# Patient Record
Sex: Male | Born: 1990 | Race: White | Hispanic: No | Marital: Single
Health system: Southern US, Community
[De-identification: ages and names within clinical notes are randomized; demographics above are authoritative.]

## PROBLEM LIST (undated history)

## (undated) DIAGNOSIS — J40 Bronchitis, not specified as acute or chronic: Secondary | ICD-10-CM

## (undated) DIAGNOSIS — Z9109 Other allergy status, other than to drugs and biological substances: Secondary | ICD-10-CM

## (undated) HISTORY — PX: APPENDECTOMY: SHX54

---
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
## 2012-03-01 ENCOUNTER — Encounter (HOSPITAL_COMMUNITY): Payer: Self-pay

## 2012-03-01 ENCOUNTER — Emergency Department (INDEPENDENT_AMBULATORY_CARE_PROVIDER_SITE_OTHER)
Admission: EM | Admit: 2012-03-01 | Discharge: 2012-03-01 | Disposition: A | Discharge status: Home / Self Care | Payer: Medicaid Other | Source: Home / Self Care | Attending: Emergency Medicine | Admitting: Emergency Medicine

## 2012-03-01 DIAGNOSIS — J069 Acute upper respiratory infection, unspecified: Secondary | ICD-10-CM

## 2012-03-01 DIAGNOSIS — J209 Acute bronchitis, unspecified: Secondary | ICD-10-CM

## 2012-03-01 MED ORDER — SALINE NASAL SPRAY 0.65 % NA SOLN: 1.0000 | NASAL | Status: DC | PRN

## 2012-03-01 MED ORDER — ALBUTEROL SULFATE HFA 108 (90 BASE) MCG/ACT IN AERS: 2.0000 | INHALATION_SPRAY | Freq: Four times a day (QID) | RESPIRATORY_TRACT | Status: DC | PRN

## 2012-03-01 MED ORDER — CETIRIZINE-PSEUDOEPHEDRINE ER 5-120 MG PO TB12: 1.0000 | ORAL_TABLET | Freq: Two times a day (BID) | ORAL | Status: DC

## 2012-03-01 NOTE — ED Provider Notes (Signed)
History     CSN: [id number]  Arrival date & time 03/01/12  1249   None     Chief Complaint  Patient presents with  . URI    (Consider location/radiation/quality/duration/timing/severity/associated sxs/prior treatment) Patient is a 21 y.o. male presenting with URI. The history is provided by the patient and a friend.  URI The primary symptoms include sore throat, cough and wheezing. Primary symptoms do not include fever or ear pain. The current episode started 3 to 5 days ago. This is a new problem.  The sore throat began more than 2 days ago. The sore throat is not accompanied by trouble swallowing or stridor.  Symptoms associated with the illness include chills, sinus pressure, congestion and rhinorrhea.  Francis Walters is a 21 y.o. male who complains of onset of cold symptoms for 3 days.  Pt has taken nyquil and motrin with minimal symptom relief.  +sore throat +cough productive No pleuritic pain +wheezing +nasal congestion +Post-nasal drainage + sinus pain/pressure No voice changes +chest congestion no itchy/red eyes no earache No hemoptysis No SOB +sweats +Fever last evening  No nausea No vomiting No abdominal pain No diarrhea No skin rashes No fatigue +myalgias No headache  +ill contacts   History reviewed. No pertinent past medical history.  History reviewed. No pertinent past surgical history.  History reviewed. No pertinent family history.  History  Substance Use Topics  . Smoking status: Not on file  . Smokeless tobacco: Not on file  . Alcohol Use: Not on file      Review of Systems  Constitutional: Positive for chills. Negative for fever, diaphoresis and appetite change.  HENT: Positive for congestion, sore throat, rhinorrhea, sneezing, postnasal drip and sinus pressure. Negative for hearing loss, ear pain, trouble swallowing, neck pain, neck stiffness, dental problem, voice change, tinnitus and ear discharge.   Respiratory: Positive for cough and  wheezing. Negative for chest tightness, shortness of breath and stridor.   Cardiovascular: Negative.     Allergies  Review of patient's allergies indicates not on file. NKDA  Home Medications   Current Outpatient Rx  Name Route Sig Dispense Refill  . ALBUTEROL SULFATE HFA 108 (90 BASE) MCG/ACT IN AERS Inhalation Inhale 2 puffs into the lungs every 6 (six) hours as needed for wheezing. 1 Inhaler 0  . CETIRIZINE-PSEUDOEPHEDRINE ER 5-120 MG PO TB12 Oral Take 1 tablet by mouth 2 (two) times daily. 30 tablet 2  . SALINE NASAL SPRAY 0.65 % NA SOLN Nasal Place 1 spray into the nose as needed for congestion. 30 mL 12    BP 145/77  Pulse 74  Temp 99 F (37.2 C) (Oral)  Resp 18  SpO2 98%  Physical Exam  Nursing note and vitals reviewed. Constitutional: He is oriented to person, place, and time. Vital signs are normal. He appears well-developed and well-nourished. He is active and cooperative.  HENT:  Head: Normocephalic.  Right Ear: Hearing, external ear and ear canal normal.  Left Ear: Hearing, external ear and ear canal normal.  Nose: Nose normal. Right sinus exhibits no maxillary sinus tenderness and no frontal sinus tenderness. Left sinus exhibits no maxillary sinus tenderness and no frontal sinus tenderness.  Mouth/Throat: Uvula is midline and mucous membranes are normal. No oropharyngeal exudate, posterior oropharyngeal edema or posterior oropharyngeal erythema.       Post nasal drip noted, Bilateral cerumen impaction  Eyes: Conjunctivae are normal. Pupils are equal, round, and reactive to light. No scleral icterus.  Neck: Trachea normal. Neck supple. No muscular  tenderness present.  Cardiovascular: Normal rate, regular rhythm, normal heart sounds and normal pulses.   Pulmonary/Chest: Effort normal and breath sounds normal.  Lymphadenopathy:       Head (right side): No submental, no submandibular, no tonsillar, no preauricular, no posterior auricular and no occipital adenopathy  present.       Head (left side): No submental, no submandibular, no tonsillar, no preauricular, no posterior auricular and no occipital adenopathy present.    He has no cervical adenopathy.  Neurological: He is alert and oriented to person, place, and time. No cranial nerve deficit or sensory deficit.  Skin: Skin is warm and dry.  Psychiatric: He has a normal mood and affect. His speech is normal and behavior is normal. Judgment and thought content normal. Cognition and memory are normal.    ED Course  Procedures (including critical care time)  Labs Reviewed - No data to display No results found.   1. URI (upper respiratory infection)   2. Acute bronchitis       MDM  Increase fluid intake, rest.  Begin expectorant/decongestant, topical decongestant, saline nasal spray and/or saline irrigation, and cough suppressant at bedtime. Antihistamines of your choice (Claritin or Zyrtec).  Tylenol or Motrin for fever/discomfort.  Followup with PCP if not improving 7 to 10 days.         Johnsie Kindred, NP 03/01/12 1608

## 2012-03-01 NOTE — ED Notes (Signed)
C/O COUGH, CONGESTION ; green nasal secretions, grey sputum w blood streaks; gums are blue (not normal for him) NAD

## 2012-03-02 NOTE — ED Provider Notes (Signed)
Medical screening examination/treatment/procedure(s) were performed by non-physician practitioner and as supervising physician I was immediately available for consultation/collaboration.  Luiz Blare MD   Luiz Blare, MD 03/02/12 2106

## 2012-04-26 ENCOUNTER — Encounter (HOSPITAL_COMMUNITY)
Admission: EM | Disposition: A | Discharge status: Home / Self Care | Payer: Self-pay | Source: Home / Self Care | Attending: Emergency Medicine

## 2012-04-26 ENCOUNTER — Encounter (HOSPITAL_COMMUNITY): Payer: Self-pay | Admitting: Anesthesiology

## 2012-04-26 ENCOUNTER — Encounter (HOSPITAL_COMMUNITY): Payer: Self-pay | Admitting: *Deleted

## 2012-04-26 ENCOUNTER — Emergency Department (HOSPITAL_COMMUNITY): Payer: Medicaid Other | Admitting: Anesthesiology

## 2012-04-26 ENCOUNTER — Observation Stay (HOSPITAL_COMMUNITY)
Admission: EM | Admit: 2012-04-26 | Discharge: 2012-04-27 | Disposition: A | Discharge status: Home / Self Care | Payer: Medicaid Other | Attending: Surgery | Admitting: Surgery

## 2012-04-26 ENCOUNTER — Emergency Department (HOSPITAL_COMMUNITY): Payer: Medicaid Other

## 2012-04-26 DIAGNOSIS — K3580 Unspecified acute appendicitis: Principal | ICD-10-CM | POA: Insufficient documentation

## 2012-04-26 DIAGNOSIS — K37 Unspecified appendicitis: Secondary | ICD-10-CM

## 2012-04-26 DIAGNOSIS — Z23 Encounter for immunization: Secondary | ICD-10-CM | POA: Insufficient documentation

## 2012-04-26 HISTORY — PX: LAPAROSCOPIC APPENDECTOMY: SHX408

## 2012-04-26 LAB — CBC WITH DIFFERENTIAL/PLATELET
Basophils Relative: 0 % (ref 0–1)
Eosinophils Absolute: 0 10*3/uL (ref 0.0–0.7)
HCT: 40.3 % (ref 39.0–52.0)
Hemoglobin: 14.4 g/dL (ref 13.0–17.0)
MCH: 27.9 pg (ref 26.0–34.0)
MCHC: 35.7 g/dL (ref 30.0–36.0)
Monocytes Absolute: 1.3 10*3/uL — ABNORMAL HIGH (ref 0.1–1.0)
Monocytes Relative: 8 % (ref 3–12)
Neutro Abs: 13.4 10*3/uL — ABNORMAL HIGH (ref 1.7–7.7)

## 2012-04-26 LAB — POCT I-STAT, CHEM 8
BUN: 10 mg/dL (ref 6–23)
Chloride: 102 mEq/L (ref 96–112)
Creatinine, Ser: 1 mg/dL (ref 0.50–1.35)
Sodium: 138 mEq/L (ref 135–145)
TCO2: 22 mmol/L (ref 0–100)

## 2012-04-26 LAB — URINALYSIS, ROUTINE W REFLEX MICROSCOPIC
Glucose, UA: NEGATIVE mg/dL
Hgb urine dipstick: NEGATIVE
Protein, ur: NEGATIVE mg/dL
pH: 6.5 (ref 5.0–8.0)

## 2012-04-26 SURGERY — APPENDECTOMY, LAPAROSCOPIC
Anesthesia: General | Site: Abdomen | Wound class: Clean Contaminated

## 2012-04-26 MED ORDER — DEXAMETHASONE SODIUM PHOSPHATE 4 MG/ML IJ SOLN
INTRAMUSCULAR | Status: DC | PRN
  Administered 2012-04-26: 4 mg via INTRAVENOUS

## 2012-04-26 MED ORDER — MOXIFLOXACIN HCL IN NACL 400 MG/250ML IV SOLN
400.0000 mg | Freq: Once | INTRAVENOUS | Status: AC
  Administered 2012-04-26 (×2): 400 mg via INTRAVENOUS
  Filled 2012-04-26: qty 250

## 2012-04-26 MED ORDER — OXYCODONE HCL 5 MG PO TABS: 5.0000 mg | ORAL_TABLET | Freq: Once | ORAL | Status: AC | PRN

## 2012-04-26 MED ORDER — MIDAZOLAM HCL 5 MG/5ML IJ SOLN
INTRAMUSCULAR | Status: DC | PRN
  Administered 2012-04-26: 2 mg via INTRAVENOUS

## 2012-04-26 MED ORDER — NEOSTIGMINE METHYLSULFATE 1 MG/ML IJ SOLN
INTRAMUSCULAR | Status: DC | PRN
  Administered 2012-04-26: 3 mg via INTRAVENOUS

## 2012-04-26 MED ORDER — GLYCOPYRROLATE 0.2 MG/ML IJ SOLN
INTRAMUSCULAR | Status: DC | PRN
  Administered 2012-04-26: 0.4 mg via INTRAVENOUS

## 2012-04-26 MED ORDER — ONDANSETRON HCL 4 MG/2ML IJ SOLN
4.0000 mg | Freq: Once | INTRAMUSCULAR | Status: AC
  Administered 2012-04-26: 4 mg via INTRAVENOUS
  Filled 2012-04-26: qty 2

## 2012-04-26 MED ORDER — OXYCODONE HCL 5 MG/5ML PO SOLN: 5.0000 mg | Freq: Once | ORAL | Status: AC | PRN

## 2012-04-26 MED ORDER — IOHEXOL 300 MG/ML  SOLN
20.0000 mL | INTRAMUSCULAR | Status: AC
  Administered 2012-04-26 (×2): 20 mL via ORAL

## 2012-04-26 MED ORDER — ROCURONIUM BROMIDE 100 MG/10ML IV SOLN
INTRAVENOUS | Status: DC | PRN
  Administered 2012-04-26: 20 mg via INTRAVENOUS

## 2012-04-26 MED ORDER — LIDOCAINE HCL (CARDIAC) 20 MG/ML IV SOLN
INTRAVENOUS | Status: DC | PRN
  Administered 2012-04-26: 100 mg via INTRAVENOUS

## 2012-04-26 MED ORDER — HYDROMORPHONE HCL PF 1 MG/ML IJ SOLN: 0.2500 mg | INTRAMUSCULAR | Status: DC | PRN

## 2012-04-26 MED ORDER — IOHEXOL 300 MG/ML  SOLN
100.0000 mL | Freq: Once | INTRAMUSCULAR | Status: AC | PRN
  Administered 2012-04-26: 100 mL via INTRAVENOUS

## 2012-04-26 MED ORDER — SODIUM CHLORIDE 0.9 % IR SOLN
Status: DC | PRN
  Administered 2012-04-26: 1000 mL

## 2012-04-26 MED ORDER — MORPHINE SULFATE 4 MG/ML IJ SOLN: 2.0000 mg | INTRAMUSCULAR | Status: DC | PRN

## 2012-04-26 MED ORDER — PROMETHAZINE HCL 25 MG/ML IJ SOLN: 6.2500 mg | INTRAMUSCULAR | Status: DC | PRN

## 2012-04-26 MED ORDER — LACTATED RINGERS IV SOLN
INTRAVENOUS | Status: DC | PRN
  Administered 2012-04-26: 23:00:00 via INTRAVENOUS

## 2012-04-26 MED ORDER — NICOTINE 21 MG/24HR TD PT24
21.0000 mg | MEDICATED_PATCH | Freq: Once | TRANSDERMAL | Status: DC
  Administered 2012-04-26: 21 mg via TRANSDERMAL
  Filled 2012-04-26: qty 1

## 2012-04-26 MED ORDER — ONDANSETRON HCL 4 MG/2ML IJ SOLN
INTRAMUSCULAR | Status: DC | PRN
  Administered 2012-04-26: 4 mg via INTRAVENOUS

## 2012-04-26 MED ORDER — SUFENTANIL CITRATE 50 MCG/ML IV SOLN
INTRAVENOUS | Status: DC | PRN
  Administered 2012-04-26 (×3): 10 ug via INTRAVENOUS

## 2012-04-26 MED ORDER — LORAZEPAM 2 MG/ML IJ SOLN
1.0000 mg | Freq: Once | INTRAMUSCULAR | Status: AC
  Administered 2012-04-26: 1 mg via INTRAVENOUS
  Filled 2012-04-26: qty 1

## 2012-04-26 MED ORDER — SODIUM CHLORIDE 0.9 % IV BOLUS (SEPSIS)
1000.0000 mL | INTRAVENOUS | Status: AC
  Administered 2012-04-26: 1000 mL via INTRAVENOUS

## 2012-04-26 MED ORDER — PROPOFOL 10 MG/ML IV BOLUS
INTRAVENOUS | Status: DC | PRN
  Administered 2012-04-26: 200 mg via INTRAVENOUS

## 2012-04-26 MED ORDER — BUPIVACAINE-EPINEPHRINE 0.25% -1:200000 IJ SOLN
INTRAMUSCULAR | Status: DC | PRN
  Administered 2012-04-26: 18 mL

## 2012-04-26 MED ORDER — MORPHINE SULFATE 4 MG/ML IJ SOLN
4.0000 mg | Freq: Once | INTRAMUSCULAR | Status: AC
Start: 2012-04-26 — End: 2012-04-26
  Administered 2012-04-26: 4 mg via INTRAVENOUS
  Filled 2012-04-26: qty 1

## 2012-04-26 MED ORDER — SUCCINYLCHOLINE CHLORIDE 20 MG/ML IJ SOLN
INTRAMUSCULAR | Status: DC | PRN
  Administered 2012-04-26: 120 mg via INTRAVENOUS

## 2012-04-26 MED ORDER — LIDOCAINE HCL 4 % MT SOLN
OROMUCOSAL | Status: DC | PRN
  Administered 2012-04-26: 4 mL via TOPICAL

## 2012-04-26 SURGICAL SUPPLY — 43 items
APPLIER CLIP ROT 10 11.4 M/L (STAPLE)
BLADE SURG ROTATE 9660 (MISCELLANEOUS) ×2 IMPLANT
CANISTER SUCTION 2500CC (MISCELLANEOUS) ×2 IMPLANT
CHLORAPREP W/TINT 26ML (MISCELLANEOUS) ×4 IMPLANT
CLIP APPLIE ROT 10 11.4 M/L (STAPLE) IMPLANT
CLOTH BEACON ORANGE TIMEOUT ST (SAFETY) ×2 IMPLANT
COVER SURGICAL LIGHT HANDLE (MISCELLANEOUS) ×2 IMPLANT
CUTTER LINEAR ENDO 35 ETS (STAPLE) IMPLANT
CUTTER LINEAR ENDO 35 ETS TH (STAPLE) ×2 IMPLANT
DECANTER SPIKE VIAL GLASS SM (MISCELLANEOUS) ×2 IMPLANT
DERMABOND ADVANCED (GAUZE/BANDAGES/DRESSINGS) ×1
DERMABOND ADVANCED .7 DNX12 (GAUZE/BANDAGES/DRESSINGS) ×1 IMPLANT
DRAPE UTILITY 15X26 W/TAPE STR (DRAPE) ×4 IMPLANT
ELECT REM PT RETURN 9FT ADLT (ELECTROSURGICAL) ×2
ELECTRODE REM PT RTRN 9FT ADLT (ELECTROSURGICAL) ×1 IMPLANT
ENDOLOOP SUT PDS II  0 18 (SUTURE)
ENDOLOOP SUT PDS II 0 18 (SUTURE) IMPLANT
GLOVE BIO SURGEON STRL SZ8 (GLOVE) ×2 IMPLANT
GLOVE BIOGEL PI IND STRL 8 (GLOVE) ×1 IMPLANT
GLOVE BIOGEL PI INDICATOR 8 (GLOVE) ×1
GOWN PREVENTION PLUS XLARGE (GOWN DISPOSABLE) ×2 IMPLANT
GOWN STRL NON-REIN LRG LVL3 (GOWN DISPOSABLE) ×4 IMPLANT
KIT BASIN OR (CUSTOM PROCEDURE TRAY) ×2 IMPLANT
KIT ROOM TURNOVER OR (KITS) ×2 IMPLANT
NS IRRIG 1000ML POUR BTL (IV SOLUTION) ×2 IMPLANT
PAD ARMBOARD 7.5X6 YLW CONV (MISCELLANEOUS) ×4 IMPLANT
POUCH SPECIMEN RETRIEVAL 10MM (ENDOMECHANICALS) ×2 IMPLANT
RELOAD /EVU35 (ENDOMECHANICALS) IMPLANT
RELOAD CUTTER ETS 35MM STAND (ENDOMECHANICALS) IMPLANT
SCALPEL HARMONIC ACE (MISCELLANEOUS) ×2 IMPLANT
SET IRRIG TUBING LAPAROSCOPIC (IRRIGATION / IRRIGATOR) ×2 IMPLANT
SPECIMEN JAR SMALL (MISCELLANEOUS) ×2 IMPLANT
SUT VIC AB 4-0 PS2 27 (SUTURE) ×2 IMPLANT
SWAB COLLECTION DEVICE MRSA (MISCELLANEOUS) IMPLANT
TOWEL OR 17X24 6PK STRL BLUE (TOWEL DISPOSABLE) ×2 IMPLANT
TOWEL OR 17X26 10 PK STRL BLUE (TOWEL DISPOSABLE) ×2 IMPLANT
TRAY FOLEY CATH 14FR (SET/KITS/TRAYS/PACK) ×2 IMPLANT
TRAY LAPAROSCOPIC (CUSTOM PROCEDURE TRAY) ×2 IMPLANT
TROCAR HASSON GELL 12X100 (TROCAR) ×2 IMPLANT
TROCAR Z-THREAD FIOS 12X100MM (TROCAR) ×2 IMPLANT
TROCAR Z-THREAD FIOS 5X100MM (TROCAR) ×2 IMPLANT
TUBE ANAEROBIC SPECIMEN COL (MISCELLANEOUS) IMPLANT
WATER STERILE IRR 1000ML POUR (IV SOLUTION) ×2 IMPLANT

## 2012-04-26 NOTE — H&P (Signed)
Francis Walters is an 21 y.o. male.   Chief Complaint: Right lower quadrant abdominal pain HPI: Patient developed right lower quadrant abdominal pain at 1 AM this morning while at work. He works at [employer]. He had associated nausea and vomiting at that time. The pain has continued. The vomiting has not recurred. He came to the emergency department. Evaluation includes white blood cell count which is elevated at 17,300 and CT scan of the abdomen and pelvis which shows acute appendicitis.  History reviewed. No pertinent past medical history.  History reviewed. No pertinent past surgical history.  History reviewed. No pertinent family history. Social History:  reports that he has been smoking.  He does not have any smokeless tobacco history on file. He reports that he does not drink alcohol. His drug history not on file.  Allergies: Possible penicillin allergy   (Not in a hospital admission)  Results for orders placed during the hospital encounter of 04/26/12 (from the past 48 hour(s))  CBC WITH DIFFERENTIAL     Status: Abnormal   Collection Time   04/26/12  5:41 PM      Component Value Range Comment   WBC 17.3 (*) 4.0 - 10.5 K/uL    RBC 5.16  4.22 - 5.81 MIL/uL    Hemoglobin 14.4  13.0 - 17.0 g/dL    HCT 16.1  09.6 - 04.5 %    MCV 78.1  78.0 - 100.0 fL    MCH 27.9  26.0 - 34.0 pg    MCHC 35.7  30.0 - 36.0 g/dL    RDW 40.9  81.1 - 91.4 %    Platelets 211  150 - 400 K/uL    Neutrophils Relative 78 (*) 43 - 77 %    Neutro Abs 13.4 (*) 1.7 - 7.7 K/uL    Lymphocytes Relative 14  12 - 46 %    Lymphs Abs 2.5  0.7 - 4.0 K/uL    Monocytes Relative 8  3 - 12 %    Monocytes Absolute 1.3 (*) 0.1 - 1.0 K/uL    Eosinophils Relative 0  0 - 5 %    Eosinophils Absolute 0.0  0.0 - 0.7 K/uL    Basophils Relative 0  0 - 1 %    Basophils Absolute 0.0  0.0 - 0.1 K/uL   LIPASE, BLOOD     Status: Normal   Collection Time   04/26/12  5:41 PM      Component Value Range Comment   Lipase 11  11 - 59  U/L   POCT I-STAT, CHEM 8     Status: Normal   Collection Time   04/26/12  6:13 PM      Component Value Range Comment   Sodium 138  135 - 145 mEq/L    Potassium 3.5  3.5 - 5.1 mEq/L    Chloride 102  96 - 112 mEq/L    BUN 10  6 - 23 mg/dL    Creatinine, Ser 7.82  0.50 - 1.35 mg/dL    Glucose, Bld 88  70 - 99 mg/dL    Calcium, Ion 9.56  2.13 - 1.23 mmol/L    TCO2 22  0 - 100 mmol/L    Hemoglobin 15.0  13.0 - 17.0 g/dL    HCT 08.6  57.8 - 46.9 %   URINALYSIS, ROUTINE W REFLEX MICROSCOPIC     Status: Normal   Collection Time   04/26/12  7:35 PM      Component Value  Range Comment   Color, Urine YELLOW  YELLOW    APPearance CLEAR  CLEAR    Specific Gravity, Urine 1.011  1.005 - 1.030    pH 6.5  5.0 - 8.0    Glucose, UA NEGATIVE  NEGATIVE mg/dL    Hgb urine dipstick NEGATIVE  NEGATIVE    Bilirubin Urine NEGATIVE  NEGATIVE    Ketones, ur NEGATIVE  NEGATIVE mg/dL    Protein, ur NEGATIVE  NEGATIVE mg/dL    Urobilinogen, UA 0.2  0.0 - 1.0 mg/dL    Nitrite NEGATIVE  NEGATIVE    Leukocytes, UA NEGATIVE  NEGATIVE MICROSCOPIC NOT DONE ON URINES WITH NEGATIVE PROTEIN, BLOOD, LEUKOCYTES, NITRITE, OR GLUCOSE <1000 mg/dL.   Ct Abdomen Pelvis W Contrast  04/26/2012  *RADIOLOGY REPORT*  Clinical Data: Right lower quadrant abdominal pain, some vomiting  CT ABDOMEN AND PELVIS WITH CONTRAST  Technique:  Multidetector CT imaging of the abdomen and pelvis was performed following the standard protocol during bolus administration of intravenous contrast.  Contrast: OMNIPAQUE IOHEXOL 300 MG/ML  SOLN  Comparison: None.  Findings: The lung bases are clear.  The liver enhances with no focal abnormality and no ductal dilatation is seen.  No calcified gallstones are noted.  The pancreas is normal in size and the pancreatic duct is not dilated.  The adrenal glands and spleen are unremarkable.  The stomach is not well distended.  The kidneys enhance with no calculus or mass and no hydronephrosis is seen. The  abdominal aorta is normal in caliber.  No adenopathy is seen.  Within the anterior right pelvis the appendix is dilated with a thickened wall which enhances, consistent with early appendicitis. No complicating features are seen.  The appendix measures up to 8 mm in diameter.  The urinary bladder is unremarkable.  The prostate is normal in size.  A small amount of free fluid is noted within the pelvis.  The terminal ileum is unremarkable.  IMPRESSION:  1.  Prominent appendix with thickened mucosa which enhances consistent with early appendicitis. 2.  Small amount of free fluid within the pelvis.   Original Report Authenticated By: Juline Patch, M.D.     Review of Systems  Constitutional: Positive for fever.  HENT: Negative.   Respiratory: Negative.   Cardiovascular: Negative.   Gastrointestinal: Positive for nausea, vomiting and abdominal pain.  Genitourinary: Negative.   Musculoskeletal: Negative.   Skin: Negative.   Neurological: Negative.   Endo/Heme/Allergies: Negative.     Blood pressure 128/73, pulse 77, temperature 97.9 F (36.6 C), temperature source Oral, resp. rate 20, SpO2 98.00%. Physical Exam  Constitutional: He is oriented to person, place, and time. He appears well-developed and well-nourished. No distress.  HENT:  Head: Normocephalic and atraumatic.  Mouth/Throat: Oropharynx is clear and moist. No oropharyngeal exudate.  Eyes: EOM are normal. Pupils are equal, round, and reactive to light. No scleral icterus.  Neck: Normal range of motion. Neck supple. No tracheal deviation present.  Cardiovascular: Normal rate, regular rhythm, normal heart sounds and intact distal pulses.   Respiratory: Effort normal and breath sounds normal. No stridor. No respiratory distress. He has no wheezes. He has no rales.  GI: Soft. He exhibits no distension. There is tenderness. There is guarding. There is no rebound.       Tender right lower quadrant with voluntary guarding, no peritonitis    Musculoskeletal: Normal range of motion.  Neurological: He is alert and oriented to person, place, and time.  Skin: Skin is warm  and dry.     Assessment/Plan Acute appendicitis: Will give intravenous antibiotics and proceed to the operating room for laparoscopic appendectomy. Procedure, risks, benefits were discussed in detail the patient and his mother. Questions were answered. Rayni Nemitz E 04/26/2012, 9:32 PM

## 2012-04-26 NOTE — Anesthesia Preprocedure Evaluation (Signed)
Anesthesia Evaluation  Patient identified by MRN, date of birth, ID band Patient awake    Reviewed: Allergy & Precautions, H&P , NPO status , Patient's Chart, lab work & pertinent test results  Airway Mallampati: I TM Distance: >3 FB Neck ROM: Full    Dental   Pulmonary Current Smoker,  breath sounds clear to auscultation        Cardiovascular Rhythm:Regular Rate:Tachycardia     Neuro/Psych    GI/Hepatic   Endo/Other    Renal/GU      Musculoskeletal   Abdominal (+)  Abdomen: tender.    Peds  Hematology   Anesthesia Other Findings   Reproductive/Obstetrics                           Anesthesia Physical Anesthesia Plan  ASA: II and Emergent  Anesthesia Plan: General   Post-op Pain Management:    Induction: Intravenous, Rapid sequence and Cricoid pressure planned  Airway Management Planned: Oral ETT  Additional Equipment:   Intra-op Plan:   Post-operative Plan: Extubation in OR  Informed Consent: I have reviewed the patients History and Physical, chart, labs and discussed the procedure including the risks, benefits and alternatives for the proposed anesthesia with the patient or authorized representative who has indicated his/her understanding and acceptance.     Plan Discussed with: CRNA and Surgeon  Anesthesia Plan Comments:         Anesthesia Quick Evaluation

## 2012-04-26 NOTE — ED Notes (Signed)
Pt instructed to get undressed and put hospital gown on.

## 2012-04-26 NOTE — ED Notes (Signed)
Pt returned from CT °

## 2012-04-26 NOTE — ED Notes (Signed)
Pt's family at nurses station c/o pt's continued severe anxiety and pt is very upset crying. Dr. Janee Morn paged and notified - TO given for repeat dose of 1mg  IV ativan.

## 2012-04-26 NOTE — Transfer of Care (Signed)
Immediate Anesthesia Transfer of Care Note  Patient: Francis Walters  Procedure(s) Performed: Procedure(s) (LRB) with comments: APPENDECTOMY LAPAROSCOPIC (N/A)  Patient Location: PACU  Anesthesia Type: General  Level of Consciousness: awake, oriented, sedated and patient cooperative  Airway & Oxygen Therapy: Patient Spontanous Breathing and Patient connected to nasal cannula oxygen  Post-op Assessment: Report given to PACU RN, Post -op Vital signs reviewed and stable and Patient moving all extremities  Post vital signs: Reviewed and stable  Complications: No apparent anesthesia complications

## 2012-04-26 NOTE — ED Notes (Signed)
Dr. Janee Morn, Gen Surg at bedside for assessment - pt signing informed consent at present.

## 2012-04-26 NOTE — ED Provider Notes (Signed)
History     CSN: [id number]  Arrival date & time 04/26/12  1249   First MD Initiated Contact with Patient 04/26/12 1706      Chief Complaint  Patient presents with  . Abdominal Pain    (Consider location/radiation/quality/duration/timing/severity/associated sxs/prior treatment) HPI Comments: This is an 21 year old male, who presents to the emergency department with chief complaint of abdominal pain. Patient states that his pain is located in the right lower quadrant. He states that the pain came on early this morning and is associated with nausea, vomiting, and diarrhea. Patient is afebrile. Patient denies radiating symptoms. His pain is moderate to severe.  The history is provided by the patient. No language interpreter was used.    History reviewed. No pertinent past medical history.  History reviewed. No pertinent past surgical history.  History reviewed. No pertinent family history.  History  Substance Use Topics  . Smoking status: Current Every Day Smoker  . Smokeless tobacco: Not on file  . Alcohol Use: No      Review of Systems  Constitutional: Negative for fever.  HENT: Negative for rhinorrhea.   Eyes: Negative for visual disturbance.  Respiratory: Negative for chest tightness and shortness of breath.   Cardiovascular: Negative for chest pain.  Gastrointestinal: Positive for nausea, vomiting, abdominal pain and diarrhea. Negative for constipation and blood in stool.  Genitourinary: Negative for dysuria and hematuria.  Musculoskeletal: Negative for back pain.  Neurological: Negative for weakness and headaches.  Psychiatric/Behavioral: The patient is not nervous/anxious.   All other systems reviewed and are negative.    Allergies  Review of patient's allergies indicates no known allergies.  Home Medications  No current outpatient prescriptions on file.  BP 128/73  Pulse 77  Temp 97.9 F (36.6 C) (Oral)  Resp 20  SpO2 98%  Physical Exam  Nursing  note and vitals reviewed. Constitutional: He is oriented to person, place, and time. He appears well-developed and well-nourished.  HENT:  Head: Normocephalic and atraumatic.  Eyes: Conjunctivae normal and EOM are normal. Pupils are equal, round, and reactive to light.  Neck: Normal range of motion. Neck supple.  Cardiovascular: Normal rate, regular rhythm and normal heart sounds.   Pulmonary/Chest: Effort normal and breath sounds normal.  Abdominal: Soft. Bowel sounds are normal. He exhibits no distension and no mass. There is tenderness. There is rebound and guarding.       Positive McBurney point tenderness, negative Rovsing is, negative obturator  Musculoskeletal: Normal range of motion.  Neurological: He is alert and oriented to person, place, and time.  Skin: Skin is warm and dry.  Psychiatric: He has a normal mood and affect. His behavior is normal. Judgment and thought content normal.    ED Course  Procedures (including critical care time)   Labs Reviewed  CBC WITH DIFFERENTIAL  LIPASE, BLOOD  URINALYSIS, ROUTINE W REFLEX MICROSCOPIC   Results for orders placed during the hospital encounter of 04/26/12  CBC WITH DIFFERENTIAL      Component Value Range   WBC 17.3 (*) 4.0 - 10.5 K/uL   RBC 5.16  4.22 - 5.81 MIL/uL   Hemoglobin 14.4  13.0 - 17.0 g/dL   HCT 14.7  82.9 - 56.2 %   MCV 78.1  78.0 - 100.0 fL   MCH 27.9  26.0 - 34.0 pg   MCHC 35.7  30.0 - 36.0 g/dL   RDW 13.0  86.5 - 78.4 %   Platelets 211  150 - 400 K/uL   Neutrophils  Relative 78 (*) 43 - 77 %   Neutro Abs 13.4 (*) 1.7 - 7.7 K/uL   Lymphocytes Relative 14  12 - 46 %   Lymphs Abs 2.5  0.7 - 4.0 K/uL   Monocytes Relative 8  3 - 12 %   Monocytes Absolute 1.3 (*) 0.1 - 1.0 K/uL   Eosinophils Relative 0  0 - 5 %   Eosinophils Absolute 0.0  0.0 - 0.7 K/uL   Basophils Relative 0  0 - 1 %   Basophils Absolute 0.0  0.0 - 0.1 K/uL  LIPASE, BLOOD      Component Value Range   Lipase 11  11 - 59 U/L  URINALYSIS,  ROUTINE W REFLEX MICROSCOPIC      Component Value Range   Color, Urine YELLOW  YELLOW   APPearance CLEAR  CLEAR   Specific Gravity, Urine 1.011  1.005 - 1.030   pH 6.5  5.0 - 8.0   Glucose, UA NEGATIVE  NEGATIVE mg/dL   Hgb urine dipstick NEGATIVE  NEGATIVE   Bilirubin Urine NEGATIVE  NEGATIVE   Ketones, ur NEGATIVE  NEGATIVE mg/dL   Protein, ur NEGATIVE  NEGATIVE mg/dL   Urobilinogen, UA 0.2  0.0 - 1.0 mg/dL   Nitrite NEGATIVE  NEGATIVE   Leukocytes, UA NEGATIVE  NEGATIVE  POCT I-STAT, CHEM 8      Component Value Range   Sodium 138  135 - 145 mEq/L   Potassium 3.5  3.5 - 5.1 mEq/L   Chloride 102  96 - 112 mEq/L   BUN 10  6 - 23 mg/dL   Creatinine, Ser 7.84  0.50 - 1.35 mg/dL   Glucose, Bld 88  70 - 99 mg/dL   Calcium, Ion 6.96  2.95 - 1.23 mmol/L   TCO2 22  0 - 100 mmol/L   Hemoglobin 15.0  13.0 - 17.0 g/dL   HCT 28.4  13.2 - 44.0 %   Ct Abdomen Pelvis W Contrast  04/26/2012  *RADIOLOGY REPORT*  Clinical Data: Right lower quadrant abdominal pain, some vomiting  CT ABDOMEN AND PELVIS WITH CONTRAST  Technique:  Multidetector CT imaging of the abdomen and pelvis was performed following the standard protocol during bolus administration of intravenous contrast.  Contrast: OMNIPAQUE IOHEXOL 300 MG/ML  SOLN  Comparison: None.  Findings: The lung bases are clear.  The liver enhances with no focal abnormality and no ductal dilatation is seen.  No calcified gallstones are noted.  The pancreas is normal in size and the pancreatic duct is not dilated.  The adrenal glands and spleen are unremarkable.  The stomach is not well distended.  The kidneys enhance with no calculus or mass and no hydronephrosis is seen. The abdominal aorta is normal in caliber.  No adenopathy is seen.  Within the anterior right pelvis the appendix is dilated with a thickened wall which enhances, consistent with early appendicitis. No complicating features are seen.  The appendix measures up to 8 mm in diameter.  The  urinary bladder is unremarkable.  The prostate is normal in size.  A small amount of free fluid is noted within the pelvis.  The terminal ileum is unremarkable.  IMPRESSION:  1.  Prominent appendix with thickened mucosa which enhances consistent with early appendicitis. 2.  Small amount of free fluid within the pelvis.   Original Report Authenticated By: Juline Patch, M.D.        1. Appendicitis       MDM  21 year old with abdominal  pain. I am suspicious of appendectomy, CT shows appy as detailed above. I have given the patient 4 of morphine.    7:33 PM Patient finished contrast and going to CT soon.  Patient states that his pain is tolerable when resting.  9:52 PM Patient has been discussed with Dr. Anitra Lauth. Patient has been seen by Dr. Janee Morn, who will take the patient to surgery tonight.        Roxy Horseman, PA-C 04/26/12 2154  Roxy Horseman, PA-C 04/26/12 2154

## 2012-04-26 NOTE — Op Note (Signed)
04/26/2012  11:38 PM  PATIENT:  Francis Walters  21 y.o. male  PRE-OPERATIVE DIAGNOSIS:  Acute appendicitis [540.9]  POST-OPERATIVE DIAGNOSIS:  Acute appendicitis [540.9]  PROCEDURE:  Procedure(s): APPENDECTOMY LAPAROSCOPIC  SURGEON:  Surgeon(s): Liz Malady, MD  PHYSICIAN ASSISTANT:   ASSISTANTS: none   ANESTHESIA:   local and general  EBL:     BLOOD ADMINISTERED:none  DRAINS: none   SPECIMEN:  Excision  DISPOSITION OF SPECIMEN:  PATHOLOGY  COUNTS:  YES  DICTATION: .Dragon Dictation Patient presented to the emergency department with right lower quadrant abdominal pain. Workup, history, and physical exam are consistent with acute appendicitis. He is brought for emergent appendectomy. He was identified in the preop holding area.Informed consent was obtained. He received intravenous antibiotics. He is brought to the operating room And general endotracheal anesthesia was administered by the anesthesia staff. His abdomen was prepped and draped in sterile fashion after placement of Foley catheter by nursing. Time out procedure was done. Informed local region was infiltrated with quarter percent Marcaine with epinephrine. Infraumbilical incision was made. Subcutaneous tissues were dissected down revealing the anterior fascia. This was divided along the midline. There was a small muscular bleeder which was cauterized. Peritoneal cavity was entered under direct vision. 0 Vicryl pursestring suture was placed on the fascial opening. Hassan trocar was inserted. Abdomen was insufflated with carbon dioxide in standard fashion. Under direct vision, a 12 mm left lower quadrant a 5 mm right mid abdomen port were placed. Local was used at port sites. Laparoscopic exploration revealed a inflamed but not perforated appendix. The base was intact. The base was dissected out. Mesoappendix was divided with harmonic scalpel achieving excellent hemostasis. Base was divided with the laparoscopic GIA with  vascular load. There was excellent staple line closure. Appendix was placed in an Endo Catch bag and removed from the abdomen via the left lower quadrant port site. Abdomen was copiously irrigated. Irrigation fluid returned clear. There was no bleeding. Staple line remained intact. Towards were removed under direct vision. Pneumoperitoneum was released. Infraumbilical fascia was closed by tying the 0 Vicryl pursestring suture with care not to trap any intra-abdominal contents. All 3 wounds were copiously irrigated and the skin of each was closed with running 4-0 Vicryl subcuticular stitch followed by Dermabond. All counts were correct. Patient tolerated procedure well without apparent complication was taken recovery in stable condition.  PATIENT DISPOSITION:  PACU - hemodynamically stable.   Delay start of Pharmacological VTE agent (>24hrs) due to surgical blood loss or risk of bleeding:  no  Violeta Gelinas, MD, MPH, FACS Pager: [phone]  10/22/201311:38 PM

## 2012-04-26 NOTE — ED Notes (Signed)
CT called, reports pt will be one of the next people to be scanned.

## 2012-04-26 NOTE — ED Notes (Signed)
Pt reports rlq abd pain that started last night, report vomting x1. States is constant, denies any urinary symptoms. Abd is tender with palpation. NAD.

## 2012-04-26 NOTE — ED Notes (Signed)
Pt went to radiology.

## 2012-04-27 ENCOUNTER — Encounter (HOSPITAL_COMMUNITY): Payer: Self-pay | Admitting: General Surgery

## 2012-04-27 HISTORY — PX: APPENDECTOMY: SHX54

## 2012-04-27 MED ORDER — OXYCODONE-ACETAMINOPHEN 5-325 MG PO TABS: 1.0000 | ORAL_TABLET | ORAL | Status: DC | PRN

## 2012-04-27 MED ORDER — ACETAMINOPHEN 325 MG PO TABS: 650.0000 mg | ORAL_TABLET | ORAL | Status: DC | PRN

## 2012-04-27 MED ORDER — MORPHINE SULFATE 2 MG/ML IJ SOLN
2.0000 mg | INTRAMUSCULAR | Status: DC | PRN
  Administered 2012-04-27 (×3): 2 mg via INTRAVENOUS
  Filled 2012-04-27 (×3): qty 1

## 2012-04-27 MED ORDER — LORAZEPAM 2 MG/ML IJ SOLN
1.0000 mg | INTRAMUSCULAR | Status: DC | PRN
  Administered 2012-04-27: 1 mg via INTRAVENOUS
  Filled 2012-04-27: qty 1

## 2012-04-27 MED ORDER — ONDANSETRON HCL 4 MG PO TABS: 4.0000 mg | ORAL_TABLET | Freq: Four times a day (QID) | ORAL | Status: DC | PRN

## 2012-04-27 MED ORDER — ONDANSETRON HCL 4 MG/2ML IJ SOLN: 4.0000 mg | Freq: Four times a day (QID) | INTRAMUSCULAR | Status: DC | PRN

## 2012-04-27 MED ORDER — OXYCODONE-ACETAMINOPHEN 5-325 MG PO TABS
1.0000 | ORAL_TABLET | ORAL | Status: DC | PRN
  Administered 2012-04-27 (×2): 2 via ORAL
  Filled 2012-04-27 (×2): qty 2

## 2012-04-27 MED ORDER — PNEUMOCOCCAL VAC POLYVALENT 25 MCG/0.5ML IJ INJ: 0.5000 mL | INJECTION | INTRAMUSCULAR | Status: DC

## 2012-04-27 MED ORDER — INFLUENZA VIRUS VACC SPLIT PF IM SUSP
0.5000 mL | INTRAMUSCULAR | Status: AC
  Administered 2012-04-27: 0.5 mL via INTRAMUSCULAR
  Filled 2012-04-27: qty 0.5

## 2012-04-27 MED ORDER — HEPARIN SODIUM (PORCINE) 5000 UNIT/ML IJ SOLN
5000.0000 [IU] | Freq: Three times a day (TID) | INTRAMUSCULAR | Status: DC
  Administered 2012-04-27: 5000 [IU] via SUBCUTANEOUS
  Filled 2012-04-27 (×3): qty 1

## 2012-04-27 MED ORDER — DEXTROSE IN LACTATED RINGERS 5 % IV SOLN
INTRAVENOUS | Status: DC
  Administered 2012-04-27 (×2): via INTRAVENOUS

## 2012-04-27 NOTE — Progress Notes (Signed)
Discharge instructions gone over with patient. Prescription and work note given. No home medications other than pain medicine. Diet, activity, and incisional care gone over. Signs and symptoms of infection gone over and when to call the doctor. Follow up appointment is made.

## 2012-04-27 NOTE — Discharge Summary (Signed)
  Patient ID: TEL HEVIA MRN: [id number] DOB/AGE: May 10, 1991 20 y.o.  Admit date: 04/26/2012 Discharge date: 04/27/2012  Procedures: laparoscopic appendectomy  Consults: None  Reason for Admission: This is an 21 yo male who presented to [facility] with abdominal pain.  He was found to have acute appendicitis and was admitted.  Admission Diagnoses:  1. Acute appendicitis  Hospital Course: The patient was admitted and taken to the operating room where he underwent a lap appy.  He tolerated the procedure well and was tolerating a diet and pain was controlled on POD# 1.  He was felt stable for dc home.  PE: Abd: soft, appropriately tender, +BS, ND, incisions c/d/i  Discharge Diagnoses:  1. Acute appendicitis, s/p lap appy  Discharge Medications:   Medication List     As of 04/27/2012 11:13 AM    TAKE these medications         oxyCODONE-acetaminophen 5-325 MG per tablet   Commonly known as: PERCOCET/ROXICET   Take 1-2 tablets by mouth every 4 (four) hours as needed.        Discharge Instructions:     Follow-up Information    Follow up with [facility]. On 05/10/2012. (3:15pm, arrive at 2:45pm)    Contact information:   [address]   [place] [address] [phone]          Signed: Letha Cape 04/27/2012, 11:13 AM

## 2012-04-27 NOTE — Anesthesia Postprocedure Evaluation (Signed)
  Anesthesia Post-op Note  Patient: CECILIA NISHIKAWA  Procedure(s) Performed: Procedure(s) (LRB) with comments: APPENDECTOMY LAPAROSCOPIC (N/A)  Patient Location: PACU  Anesthesia Type: General  Level of Consciousness: awake and alert   Airway and Oxygen Therapy: Patient Spontanous Breathing  Post-op Pain: mild  Post-op Assessment: Post-op Vital signs reviewed, Patient's Cardiovascular Status Stable, Respiratory Function Stable, Patent Airway, No signs of Nausea or vomiting and Pain level controlled  Post-op Vital Signs: stable  Complications: No apparent anesthesia complications

## 2012-04-28 NOTE — ED Provider Notes (Signed)
Medical screening examination/treatment/procedure(s) were performed by non-physician practitioner and as supervising physician I was immediately available for consultation/collaboration.  Juliet Rude. Rubin Payor, MD 04/28/12 1191

## 2012-05-02 ENCOUNTER — Other Ambulatory Visit (INDEPENDENT_AMBULATORY_CARE_PROVIDER_SITE_OTHER): Payer: Self-pay

## 2012-05-02 DIAGNOSIS — G8918 Other acute postprocedural pain: Secondary | ICD-10-CM

## 2012-05-02 MED ORDER — HYDROCODONE-ACETAMINOPHEN 5-325 MG PO TABS: 1.0000 | ORAL_TABLET | Freq: Four times a day (QID) | ORAL | Status: DC | PRN

## 2012-05-02 NOTE — Telephone Encounter (Signed)
Patients girlfriend called in to say there is an end of a stitch that has come to the surface of his skin and wondered if she could just snip it with some scissors. I told her that was ok and that is what we would do if he came in for a visit. He is out of pain medicine so i called in protocol [facility] [address]

## 2012-05-10 ENCOUNTER — Ambulatory Visit (INDEPENDENT_AMBULATORY_CARE_PROVIDER_SITE_OTHER): Payer: Medicaid Other | Admitting: General Surgery

## 2012-05-10 ENCOUNTER — Encounter (INDEPENDENT_AMBULATORY_CARE_PROVIDER_SITE_OTHER): Payer: Self-pay

## 2012-05-10 ENCOUNTER — Encounter (INDEPENDENT_AMBULATORY_CARE_PROVIDER_SITE_OTHER): Payer: Self-pay | Admitting: General Surgery

## 2012-05-10 VITALS — BP 124/74 | HR 78 | Temp 97.4°F | Ht 71.0 in | Wt 193.6 lb

## 2012-05-10 DIAGNOSIS — K3580 Unspecified acute appendicitis: Secondary | ICD-10-CM

## 2012-05-10 DIAGNOSIS — Z9049 Acquired absence of other specified parts of digestive tract: Secondary | ICD-10-CM

## 2012-05-10 DIAGNOSIS — Z9889 Other specified postprocedural states: Secondary | ICD-10-CM

## 2012-05-10 NOTE — Progress Notes (Signed)
  Subjective: Pt returns to the clinic today after undergoing laparoscopic appendectomy on 04/27/12 by Dr. Janee Morn.  The pathology showed acute appendicitis and serositis.  The patient is tolerating their diet well and is having no severe pain.  Bowel function is good.  No problems with the wounds.  Pt concerned about getting back to work, because money is very tight.  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  Pt also smokes 1ppd for the last 2-3 years.    Objective: Vital signs in last 24 hours: Reviewed  PE: Abd: soft, non-tender, incisions well healed Extremities:  Fingernails very thickened, white with little capillary refill  Assessment/Plan  1.  S/P Laparoscopic Appendectomy: doing well, may resume low impact activity as tolerated.  Pt can resume lifting activities as normal in 2-3 weeks.  If pt can return to work with light duty <15lbs he can return to work.  Pt will follow up with [place] [employer] and knows to call with questions or concerns.  Take Motrin PRN soreness/pain. 2.  Tobacco use:  Extensive conversation about quitting smoking and consequence of continued smoking especially healing after surgery, given info about tobacco cessation program at [facility], [facility] 05/10/2012

## 2012-05-10 NOTE — Patient Instructions (Signed)
Follow up as needed.  Return to work in 2 weeks (24 May 2012).

## 2012-05-19 ENCOUNTER — Telehealth (INDEPENDENT_AMBULATORY_CARE_PROVIDER_SITE_OTHER): Payer: Self-pay | Admitting: General Surgery

## 2012-05-19 ENCOUNTER — Encounter (INDEPENDENT_AMBULATORY_CARE_PROVIDER_SITE_OTHER): Payer: Self-pay | Admitting: General Surgery

## 2012-05-19 NOTE — Telephone Encounter (Signed)
As discussed with pt and his mother yesterday evening, paged Dr. Janee Morn and asked if OK for him to RTW tonight.  Pt states he job does not require him to perform anything more strenuous than what he has been doing around the house now.   Dr. Janee Morn agrees this is acceptable.  Contacted pt to let him know he has MD approval and a new RTW letter is ready for him.  He will call back with a FAX number to send to his employer.

## 2012-05-20 ENCOUNTER — Telehealth (INDEPENDENT_AMBULATORY_CARE_PROVIDER_SITE_OTHER): Payer: Self-pay | Admitting: General Surgery

## 2012-05-20 NOTE — Telephone Encounter (Signed)
He called to say that he had 2 episodes of blood in his urine and some urinary hesitancy.  He doesn't have any abdominal pain and is still able to urinate but it does cause some discomfort.  I explained that this could be a UTI or kidney stone but doubtful that it would be related to his appendectomy 3 weeks later.  I recommended that he go to the ER for urine evaluation and possible kidney stone workup.

## 2012-05-21 ENCOUNTER — Encounter (HOSPITAL_COMMUNITY): Payer: Self-pay | Admitting: Emergency Medicine

## 2012-05-21 ENCOUNTER — Emergency Department (HOSPITAL_COMMUNITY)
Admission: EM | Admit: 2012-05-21 | Discharge: 2012-05-21 | Disposition: A | Discharge status: Home / Self Care | Payer: Medicaid Other | Attending: Emergency Medicine | Admitting: Emergency Medicine

## 2012-05-21 DIAGNOSIS — N390 Urinary tract infection, site not specified: Secondary | ICD-10-CM | POA: Insufficient documentation

## 2012-05-21 DIAGNOSIS — F17200 Nicotine dependence, unspecified, uncomplicated: Secondary | ICD-10-CM | POA: Insufficient documentation

## 2012-05-21 LAB — URINALYSIS, ROUTINE W REFLEX MICROSCOPIC
Bilirubin Urine: NEGATIVE
Glucose, UA: NEGATIVE mg/dL
Ketones, ur: NEGATIVE mg/dL
Nitrite: POSITIVE — AB
Protein, ur: 30 mg/dL — AB
Specific Gravity, Urine: 1.027 (ref 1.005–1.030)
Urobilinogen, UA: 1 mg/dL (ref 0.0–1.0)
pH: 5.5 (ref 5.0–8.0)

## 2012-05-21 LAB — URINE MICROSCOPIC-ADD ON

## 2012-05-21 NOTE — ED Notes (Signed)
C/o burning on urination started yesterday, and states urine has reddish tint to it. Ambulatory

## 2012-05-21 NOTE — ED Notes (Signed)
Pt didn't want to stay for results of urinalysis. PA Narvaez aware.

## 2012-05-21 NOTE — ED Provider Notes (Signed)
History     CSN: [id number]  Arrival date & time 05/21/12  1751   First MD Initiated Contact with Patient 05/21/12 1854      Chief Complaint  Patient presents with  . Dysuria    (Consider location/radiation/quality/duration/timing/severity/associated sxs/prior treatment) Patient is a 21 y.o. male presenting with dysuria. The history is provided by the patient.  Dysuria  This is a new problem. The current episode started yesterday. The problem occurs every urination. The problem has not changed since onset.The quality of the pain is described as burning. There has been no fever. Pertinent negatives include no chills and no nausea. Associated symptoms comments: He started having burning with urination yesterday and frequency. No abdominal pain or fever. He denies penile discharge, testicular pain or scrotal swelling.Marland Kitchen    History reviewed. No pertinent past medical history.  Past Surgical History  Procedure Date  . Laparoscopic appendectomy 04/26/2012    Procedure: APPENDECTOMY LAPAROSCOPIC;  Surgeon: Liz Malady, MD;  Location: [facility] OR;  Service: General;  Laterality: N/A;  . Appendectomy 04/27/12    No family history on file.  History  Substance Use Topics  . Smoking status: Current Every Day Smoker -- 0.5 packs/day    Types: Cigarettes  . Smokeless tobacco: Not on file  . Alcohol Use: No      Review of Systems  Constitutional: Negative for fever and chills.  Gastrointestinal: Negative.  Negative for nausea and abdominal pain.  Genitourinary: Positive for dysuria. Negative for discharge, scrotal swelling and testicular pain.  Musculoskeletal: Negative.   Skin: Negative.   Neurological: Negative.     Allergies  Review of patient's allergies indicates no known allergies.  Home Medications   Current Outpatient Rx  Name  Route  Sig  Dispense  Refill  . PHENAZOPYRIDINE HCL 95 MG PO TABS   Oral   Take 95 mg by mouth once.           BP 109/55  Pulse 88   Resp 16  SpO2 98%  Physical Exam  Constitutional: He is oriented to person, place, and time. He appears well-developed and well-nourished.  Neck: Normal range of motion.  Pulmonary/Chest: Effort normal.  Abdominal: Soft. There is no tenderness.  Musculoskeletal: Normal range of motion.  Neurological: He is alert and oriented to person, place, and time.  Skin: Skin is warm and dry.  Psychiatric: He has a normal mood and affect.    ED Course  Procedures (including critical care time)   Labs Reviewed  URINALYSIS, ROUTINE W REFLEX MICROSCOPIC   Results for orders placed during the hospital encounter of 05/21/12  URINALYSIS, ROUTINE W REFLEX MICROSCOPIC      Component Value Range   Color, Urine ORANGE (*) YELLOW   APPearance CLOUDY (*) CLEAR   Specific Gravity, Urine 1.027  1.005 - 1.030   pH 5.5  5.0 - 8.0   Glucose, UA NEGATIVE  NEGATIVE mg/dL   Hgb urine dipstick LARGE (*) NEGATIVE   Bilirubin Urine NEGATIVE  NEGATIVE   Ketones, ur NEGATIVE  NEGATIVE mg/dL   Protein, ur 30 (*) NEGATIVE mg/dL   Urobilinogen, UA 1.0  0.0 - 1.0 mg/dL   Nitrite POSITIVE (*) NEGATIVE   Leukocytes, UA SMALL (*) NEGATIVE  URINE MICROSCOPIC-ADD ON      Component Value Range   Squamous Epithelial / LPF RARE  RARE   WBC, UA 21-50  <3 WBC/hpf   RBC / HPF TOO NUMEROUS TO COUNT  <3 RBC/hpf   Bacteria, UA  RARE  RARE    No results found.   No diagnosis found.  1. uti   MDM  The patient had to leave the department prior to results of urinalysis. Discussed that if results were positive that it ws important to consider an STD source. He did not want to be checked here and could not wait for results. Contact was made by phone with diagnosis of UTI and [facility] [address]. He acknowledged understanding that he would need to be checked for STD's at [facility].         Rodena Medin, PA-C 05/21/12 2104

## 2012-05-22 NOTE — ED Provider Notes (Signed)
Medical screening examination/treatment/procedure(s) were performed by non-physician practitioner and as supervising physician I was immediately available for consultation/collaboration.  Donnamarie Shankles R. Tomeka Kantner, MD 05/22/12 0012 

## 2012-11-07 ENCOUNTER — Emergency Department (HOSPITAL_COMMUNITY): Payer: Self-pay

## 2012-11-07 ENCOUNTER — Emergency Department (HOSPITAL_COMMUNITY)
Admission: EM | Admit: 2012-11-07 | Discharge: 2012-11-07 | Disposition: A | Discharge status: Home / Self Care | Payer: Self-pay | Attending: Emergency Medicine | Admitting: Emergency Medicine

## 2012-11-07 ENCOUNTER — Encounter (HOSPITAL_COMMUNITY): Payer: Self-pay | Admitting: Nurse Practitioner

## 2012-11-07 DIAGNOSIS — R0602 Shortness of breath: Secondary | ICD-10-CM | POA: Insufficient documentation

## 2012-11-07 DIAGNOSIS — J40 Bronchitis, not specified as acute or chronic: Secondary | ICD-10-CM | POA: Insufficient documentation

## 2012-11-07 DIAGNOSIS — Z8709 Personal history of other diseases of the respiratory system: Secondary | ICD-10-CM | POA: Insufficient documentation

## 2012-11-07 DIAGNOSIS — F17200 Nicotine dependence, unspecified, uncomplicated: Secondary | ICD-10-CM | POA: Insufficient documentation

## 2012-11-07 DIAGNOSIS — Z9109 Other allergy status, other than to drugs and biological substances: Secondary | ICD-10-CM | POA: Insufficient documentation

## 2012-11-07 DIAGNOSIS — IMO0001 Reserved for inherently not codable concepts without codable children: Secondary | ICD-10-CM | POA: Insufficient documentation

## 2012-11-07 HISTORY — DX: Other allergy status, other than to drugs and biological substances: Z91.09

## 2012-11-07 HISTORY — DX: Bronchitis, not specified as acute or chronic: J40

## 2012-11-07 MED ORDER — PREDNISONE 20 MG PO TABS: 60.0000 mg | ORAL_TABLET | Freq: Every day | ORAL | Status: AC

## 2012-11-07 MED ORDER — BENZONATATE 100 MG PO CAPS: 100.0000 mg | ORAL_CAPSULE | Freq: Three times a day (TID) | ORAL | Status: DC | PRN

## 2012-11-07 NOTE — ED Provider Notes (Signed)
History  This chart was scribed for Gerhard Munch, MD by Ardeen Jourdain, ED Scribe. This patient was seen in room TR07C/TR07C and the patient's care was started at 1735.  CSN: [id number]  Arrival date & time 11/07/12  1359   None     Chief Complaint  Patient presents with  . URI      The history is provided by the patient. No language interpreter was used.    Francis Walters is a 22 y.o. male with a h/o bronchitis who presents to the Emergency Department complaining of gradual onset, gradually worsening, constant productive cough with associated mild body aches and mild SOB that began 3 weeks ago. Pt states the symptoms "feels like when I had bronchitis." Pt describes the sputum as green in color. He denies any CP, rash, SOB, leg swelling, fever, nausea, emesis and syncope. Pt is a current everyday smoking.    Past Medical History  Diagnosis Date  . Bronchitis   . Environmental allergies     Past Surgical History  Procedure Laterality Date  . Laparoscopic appendectomy  04/26/2012    Procedure: APPENDECTOMY LAPAROSCOPIC;  Surgeon: Liz Malady, MD;  Location: [facility] OR;  Service: General;  Laterality: N/A;  . Appendectomy  04/27/12    History reviewed. No pertinent family history.  History  Substance Use Topics  . Smoking status: Current Every Day Smoker -- 0.50 packs/day    Types: Cigarettes  . Smokeless tobacco: Not on file  . Alcohol Use: Yes     Comment: social      Review of Systems  Constitutional:       Per HPI, otherwise negative  HENT:       Per HPI, otherwise negative  Respiratory:       Per HPI, otherwise negative  Cardiovascular:       Per HPI, otherwise negative  Gastrointestinal: Negative for vomiting.  Endocrine:       Negative aside from HPI  Genitourinary:       Neg aside from HPI   Musculoskeletal:       Per HPI, otherwise negative  Skin: Negative.   Neurological: Negative for syncope.  All other systems reviewed and are  negative.    Allergies  Review of patient's allergies indicates no known allergies.  Home Medications  No current outpatient prescriptions on file.  Triage Vitals: BP 130/72  Pulse 80  Temp(Src) 97.3 F (36.3 C) (Oral)  Resp 18  SpO2 99%  Physical Exam  Nursing note and vitals reviewed. Constitutional: He is oriented to person, place, and time. He appears well-developed. No distress.  HENT:  Head: Normocephalic and atraumatic.  Eyes: Conjunctivae and EOM are normal.  Cardiovascular: Normal rate, regular rhythm and normal heart sounds.  Exam reveals no gallop and no friction rub.   No murmur heard. Pulmonary/Chest: Effort normal and breath sounds normal. No stridor. No respiratory distress. He has no wheezes. He has no rales. He exhibits no tenderness.  Abdominal: He exhibits no distension.  Musculoskeletal: He exhibits no edema.  Neurological: He is alert and oriented to person, place, and time.  Skin: Skin is warm and dry. He is not diaphoretic.  Psychiatric: He has a normal mood and affect.    ED Course  Procedures (including critical care time)  5:47 PM-Discussed treatment plan which includes a CXR and steroids with pt at bedside and pt agreed to plan.    Labs Reviewed - No data to display Dg Chest 2 View  11/07/2012  *  RADIOLOGY REPORT*  Clinical Data: Upper respiratory infection, cough, congestion, shortness of breath, chest pain, active smoker  CHEST - 2 VIEW  Comparison: None.  Findings: Normal cardiac silhouette and mediastinal contours. There is mild diffuse thickening of the pulmonary interstitium.  No focal airspace opacity.  No pleural effusion or pneumothorax.  No acute osseous abnormalities.  IMPRESSION: Suspected mild airways disease/bronchitis.  No focal airspace opacities to suggest pneumonia.   Original Report Authenticated By: Tacey Ruiz, MD    I interpreted the x-ray, demonstrated to the patient  No diagnosis found.    MDM   I personally  performed the services described in this documentation, which was scribed in my presence. The recorded information has been reviewed and is accurate.   Patient presents with one month of cough area on exam he is afebrile, with clear lung sounds, normal vital signs.  The patient does smoke, and was advised to stop. History for bronchitis, and absent any evidence of ongoing infection, antibiotics were not indicated   Gerhard Munch, MD 11/07/12 1753

## 2012-11-07 NOTE — ED Notes (Signed)
Pt reports cough with green mucous, body aches, mild SOB when waking up for past 3 weeks. States "feels like when I had bronchitis." A&Ox4, resp e/u

## 2014-10-26 ENCOUNTER — Emergency Department (HOSPITAL_COMMUNITY)
Admission: EM | Admit: 2014-10-26 | Discharge: 2014-10-26 | Disposition: A | Discharge status: Home / Self Care | Payer: Medicaid Other | Attending: Emergency Medicine | Admitting: Emergency Medicine

## 2014-10-26 ENCOUNTER — Encounter (HOSPITAL_COMMUNITY): Payer: Self-pay | Admitting: Emergency Medicine

## 2014-10-26 DIAGNOSIS — Z79899 Other long term (current) drug therapy: Secondary | ICD-10-CM | POA: Insufficient documentation

## 2014-10-26 DIAGNOSIS — Z720 Tobacco use: Secondary | ICD-10-CM | POA: Insufficient documentation

## 2014-10-26 DIAGNOSIS — J020 Streptococcal pharyngitis: Secondary | ICD-10-CM | POA: Insufficient documentation

## 2014-10-26 LAB — RAPID STREP SCREEN (MED CTR MEBANE ONLY): STREPTOCOCCUS, GROUP A SCREEN (DIRECT): POSITIVE — AB

## 2014-10-26 MED ORDER — IBUPROFEN 600 MG PO TABS: 600.0000 mg | ORAL_TABLET | Freq: Four times a day (QID) | ORAL | Status: DC | PRN

## 2014-10-26 MED ORDER — IBUPROFEN 400 MG PO TABS
600.0000 mg | ORAL_TABLET | Freq: Once | ORAL | Status: AC
  Administered 2014-10-26: 600 mg via ORAL
  Filled 2014-10-26: qty 2

## 2014-10-26 MED ORDER — PENICILLIN G BENZATHINE 1200000 UNIT/2ML IM SUSP
1.2000 10*6.[IU] | Freq: Once | INTRAMUSCULAR | Status: AC
  Administered 2014-10-26: 1.2 10*6.[IU] via INTRAMUSCULAR
  Filled 2014-10-26: qty 2

## 2014-10-26 NOTE — Discharge Instructions (Signed)
Ibuprofen for pain. Salt water gargles. Follow up with your primary care doctor or return if worsening.   Strep Throat Strep throat is an infection of the throat caused by a bacteria named Streptococcus pyogenes. Your health care provider may call the infection streptococcal "tonsillitis" or "pharyngitis" depending on whether there are signs of inflammation in the tonsils or back of the throat. Strep throat is most common in children aged 22-15 years during the cold months of the year, but it can occur in people of any age during any season. This infection is spread from person to person (contagious) through coughing, sneezing, or other close contact. SIGNS AND SYMPTOMS   Fever or chills.  Painful, swollen, red tonsils or throat.  Pain or difficulty when swallowing.  White or yellow spots on the tonsils or throat.  Swollen, tender lymph nodes or "glands" of the neck or under the jaw.  Red rash all over the body (rare). DIAGNOSIS  Many different infections can cause the same symptoms. A test must be done to confirm the diagnosis so the right treatment can be given. A "rapid strep test" can help your health care provider make the diagnosis in a few minutes. If this test is not available, a light swab of the infected area can be used for a throat culture test. If a throat culture test is done, results are usually available in a day or two. TREATMENT  Strep throat is treated with antibiotic medicine. HOME CARE INSTRUCTIONS   Gargle with 1 tsp of salt in 1 cup of warm water, 3-4 times per day or as needed for comfort.  Family members who also have a sore throat or fever should be tested for strep throat and treated with antibiotics if they have the strep infection.  Make sure everyone in your household washes their hands well.  Do not share food, drinking cups, or personal items that could cause the infection to spread to others.  You may need to eat a soft food diet until your sore throat  gets better.  Drink enough water and fluids to keep your urine clear or pale yellow. This will help prevent dehydration.  Get plenty of rest.  Stay home from school, day care, or work until you have been on antibiotics for 24 hours.  Take medicines only as directed by your health care provider.  Take your antibiotic medicine as directed by your health care provider. Finish it even if you start to feel better. SEEK MEDICAL CARE IF:   The glands in your neck continue to enlarge.  You develop a rash, cough, or earache.  You cough up green, yellow-brown, or bloody sputum.  You have pain or discomfort not controlled by medicines.  Your problems seem to be getting worse rather than better.  You have a fever. SEEK IMMEDIATE MEDICAL CARE IF:   You develop any new symptoms such as vomiting, severe headache, stiff or painful neck, chest pain, shortness of breath, or trouble swallowing.  You develop severe throat pain, drooling, or changes in your voice.  You develop swelling of the neck, or the skin on the neck becomes red and tender.  You develop signs of dehydration, such as fatigue, dry mouth, and decreased urination.  You become increasingly sleepy, or you cannot wake up completely. MAKE SURE YOU:  Understand these instructions.  Will watch your condition.  Will get help right away if you are not doing well or get worse. Document Released: 06/19/2000 Document Revised: 11/06/2013 Document Reviewed:  08/21/2010 ExitCare Patient Information 2015 [employer] [place]. This information is not intended to replace advice given to you by your health care provider. Make sure you discuss any questions you have with your health care provider.

## 2014-10-26 NOTE — ED Provider Notes (Signed)
CSN: [id number]     Arrival date & time 10/26/14  1038 History  This chart was scribed for non-physician practitioner Jaynie Crumble working with Jerelyn Scott, MD by Carl Best, ED Scribe. This patient was seen in room TR04C/TR04C and the patient's care was started at 12:30 PM.    Chief Complaint  Patient presents with  . flu like symptoms     The history is provided by the patient. No language interpreter was used.   HPI Comments: Francis Walters is a 24 y.o. male who presents to the Emergency Department complaining of constant sore throat that started last night after he got out of the shower.  He noticed a cough, chills, and diaphoresis last night however these symptoms have resolved and the sore throat is the only symptom that persists.  He has not taken any medication for his symptoms.  He denies sick contacts.  He lists dysphagia, difficulty sleeping, and decreased appetite as associated symptoms.  He denies fever, nausea, and vomiting as associated symptoms.    Past Medical History  Diagnosis Date  . Bronchitis   . Environmental allergies    Past Surgical History  Procedure Laterality Date  . Laparoscopic appendectomy  04/26/2012    Procedure: APPENDECTOMY LAPAROSCOPIC;  Surgeon: Liz Malady, MD;  Location: [facility] OR;  Service: General;  Laterality: N/A;  . Appendectomy  04/27/12   No family history on file. History  Substance Use Topics  . Smoking status: Current Every Day Smoker -- 0.50 packs/day    Types: Cigarettes  . Smokeless tobacco: Not on file  . Alcohol Use: Yes     Comment: social    Review of Systems  Constitutional: Positive for appetite change. Negative for fever, chills and diaphoresis.  HENT: Positive for sore throat.   Respiratory: Negative for cough.   Gastrointestinal: Negative for nausea and vomiting.  Psychiatric/Behavioral: Positive for sleep disturbance.  All other systems reviewed and are negative.   Allergies  Review of patient's  allergies indicates no known allergies.  Home Medications   Prior to Admission medications   Medication Sig Start Date End Date Taking? Authorizing Provider  benzonatate (TESSALON) 100 MG capsule Take 1 capsule (100 mg total) by mouth 3 (three) times daily as needed for cough. 11/07/12   Gerhard Munch, MD   Triage Vitals: BP 138/74 mmHg  Pulse 112  Temp(Src) 99 F (37.2 C) (Oral)  Resp 21  SpO2 100%  Physical Exam  Constitutional: He is oriented to person, place, and time. He appears well-developed and well-nourished.  HENT:  Head: Normocephalic and atraumatic.  Right Ear: External ear normal.  Left Ear: External ear normal.  Nose: Nose normal.  Anxious erythematous. Bilaterally enlarged tonsils with exudate. Uvula is midline. No trismus. TMs normal bilaterally  Eyes: EOM are normal.  Neck: Normal range of motion. Neck supple.  Cardiovascular: Normal rate, regular rhythm and normal heart sounds.   Pulmonary/Chest: Effort normal and breath sounds normal. No respiratory distress. He has no wheezes.  Musculoskeletal: Normal range of motion.  Lymphadenopathy:    He has cervical adenopathy.  Neurological: He is alert and oriented to person, place, and time.  Skin: Skin is warm and dry.  Psychiatric: He has a normal mood and affect. His behavior is normal.  Nursing note and vitals reviewed.   ED Course  Procedures (including critical care time)  DIAGNOSTIC STUDIES: Oxygen Saturation is 100% on room air, normal by my interpretation.    COORDINATION OF CARE: 12:32 PM- Will  administer a shot of Penicillin in the ED.  Advised the patient to alternate between Ibuprofen and Tylenol.  The patient agreed to the treatment plan.  Labs Review Labs Reviewed  RAPID STREP SCREEN - Abnormal; Notable for the following:    Streptococcus, Group A Screen (Direct) POSITIVE (*)    All other components within normal limits    Imaging Review No results found.   EKG Interpretation None       MDM   Final diagnoses:  Strep pharyngitis    Patient with acute onset of sore throat, onset yesterday. Rapid strep was positive. Patient received penicillin 1.2 million units IM. Will discharge home with symptomatic treatment, follow-up as needed.  Filed Vitals:   10/26/14 1043 10/26/14 1335  BP: 138/74 113/63  Pulse: 112 73  Temp: 99 F (37.2 C) 98.8 F (37.1 C)  TempSrc: Oral Oral  Resp: 21 18  SpO2: 100% 99%     Filed Vitals:   10/26/14 1043 10/26/14 1335  BP: 138/74 113/63  Pulse: 112 73  Temp: 99 F (37.2 C) 98.8 F (37.1 C)  TempSrc: Oral Oral  Resp: 21 18  SpO2: 100% 99%      Jaynie Crumbleatyana Durwin [name], PA-C 10/27/14 1601  Jerelyn ScottMartha Linker, MD 11/05/14 1507

## 2014-10-26 NOTE — ED Notes (Signed)
Patient states "i was fine until I took a shower last night and then I had a headache and sore throat".  Patient states he was having cold chills and fever (but didn't check his temperature).   Patient states he was sweating through the night.   Patient states he doesn't have a PCP and is waiting on insurance.

## 2016-09-27 ENCOUNTER — Emergency Department (HOSPITAL_COMMUNITY): Payer: Self-pay

## 2016-09-27 ENCOUNTER — Observation Stay (HOSPITAL_COMMUNITY)
Admission: EM | Admit: 2016-09-27 | Discharge: 2016-09-29 | Discharge status: Against Medical Advice | Payer: Medicaid Other | Attending: Family Medicine | Admitting: Family Medicine

## 2016-09-27 ENCOUNTER — Encounter (HOSPITAL_COMMUNITY): Payer: Self-pay | Admitting: Emergency Medicine

## 2016-09-27 DIAGNOSIS — Z79899 Other long term (current) drug therapy: Secondary | ICD-10-CM | POA: Insufficient documentation

## 2016-09-27 DIAGNOSIS — R740 Nonspecific elevation of levels of transaminase and lactic acid dehydrogenase [LDH]: Secondary | ICD-10-CM | POA: Insufficient documentation

## 2016-09-27 DIAGNOSIS — B1920 Unspecified viral hepatitis C without hepatic coma: Principal | ICD-10-CM | POA: Insufficient documentation

## 2016-09-27 DIAGNOSIS — R1011 Right upper quadrant pain: Secondary | ICD-10-CM

## 2016-09-27 DIAGNOSIS — R109 Unspecified abdominal pain: Secondary | ICD-10-CM | POA: Diagnosis present

## 2016-09-27 DIAGNOSIS — F17210 Nicotine dependence, cigarettes, uncomplicated: Secondary | ICD-10-CM | POA: Insufficient documentation

## 2016-09-27 DIAGNOSIS — J069 Acute upper respiratory infection, unspecified: Secondary | ICD-10-CM | POA: Insufficient documentation

## 2016-09-27 DIAGNOSIS — R768 Other specified abnormal immunological findings in serum: Secondary | ICD-10-CM

## 2016-09-27 DIAGNOSIS — F1290 Cannabis use, unspecified, uncomplicated: Secondary | ICD-10-CM | POA: Insufficient documentation

## 2016-09-27 DIAGNOSIS — R17 Unspecified jaundice: Secondary | ICD-10-CM | POA: Insufficient documentation

## 2016-09-27 DIAGNOSIS — K759 Inflammatory liver disease, unspecified: Secondary | ICD-10-CM

## 2016-09-27 LAB — CBC WITH DIFFERENTIAL/PLATELET
BASOS PCT: 0 %
Basophils Absolute: 0 10*3/uL (ref 0.0–0.1)
EOS PCT: 3 %
Eosinophils Absolute: 0.2 10*3/uL (ref 0.0–0.7)
HCT: 47 % (ref 39.0–52.0)
HEMOGLOBIN: 16.2 g/dL (ref 13.0–17.0)
Lymphocytes Relative: 26 %
Lymphs Abs: 2.1 10*3/uL (ref 0.7–4.0)
MCH: 28.1 pg (ref 26.0–34.0)
MCHC: 34.5 g/dL (ref 30.0–36.0)
MCV: 81.6 fL (ref 78.0–100.0)
MONO ABS: 0.9 10*3/uL (ref 0.1–1.0)
Monocytes Relative: 11 %
NEUTROS ABS: 5 10*3/uL (ref 1.7–7.7)
NEUTROS PCT: 60 %
PLATELETS: 234 10*3/uL (ref 150–400)
RBC: 5.76 MIL/uL (ref 4.22–5.81)
RDW: 13.9 % (ref 11.5–15.5)
WBC: 8.2 10*3/uL (ref 4.0–10.5)

## 2016-09-27 LAB — COMPREHENSIVE METABOLIC PANEL
ALK PHOS: 122 U/L (ref 38–126)
ALT: 2372 U/L — ABNORMAL HIGH (ref 17–63)
AST: 1631 U/L — ABNORMAL HIGH (ref 15–41)
Albumin: 3.8 g/dL (ref 3.5–5.0)
Anion gap: 11 (ref 5–15)
BUN: 9 mg/dL (ref 6–20)
CALCIUM: 9.5 mg/dL (ref 8.9–10.3)
CO2: 27 mmol/L (ref 22–32)
Chloride: 98 mmol/L — ABNORMAL LOW (ref 101–111)
Creatinine, Ser: 1.02 mg/dL (ref 0.61–1.24)
Glucose, Bld: 66 mg/dL (ref 65–99)
Potassium: 3.9 mmol/L (ref 3.5–5.1)
Sodium: 136 mmol/L (ref 135–145)
Total Bilirubin: 7.7 mg/dL — ABNORMAL HIGH (ref 0.3–1.2)
Total Protein: 7 g/dL (ref 6.5–8.1)

## 2016-09-27 LAB — URINALYSIS, ROUTINE W REFLEX MICROSCOPIC
Glucose, UA: NEGATIVE mg/dL
HGB URINE DIPSTICK: NEGATIVE
KETONES UR: NEGATIVE mg/dL
Leukocytes, UA: NEGATIVE
Nitrite: NEGATIVE
PROTEIN: NEGATIVE mg/dL
Specific Gravity, Urine: 1.019 (ref 1.005–1.030)
pH: 7 (ref 5.0–8.0)

## 2016-09-27 LAB — PROTIME-INR
INR: 1.28
PROTHROMBIN TIME: 16.1 s — AB (ref 11.4–15.2)

## 2016-09-27 LAB — ACETAMINOPHEN LEVEL: Acetaminophen (Tylenol), Serum: <10 ug/mL — ABNORMAL LOW (ref 10–30)

## 2016-09-27 LAB — LIPASE, BLOOD: LIPASE: 12 U/L (ref 11–51)

## 2016-09-27 MED ORDER — ONDANSETRON HCL 4 MG/2ML IJ SOLN
4.0000 mg | Freq: Once | INTRAMUSCULAR | Status: AC
  Administered 2016-09-27: 4 mg via INTRAVENOUS
  Filled 2016-09-27: qty 2

## 2016-09-27 MED ORDER — PANTOPRAZOLE SODIUM 40 MG PO TBEC
40.0000 mg | DELAYED_RELEASE_TABLET | Freq: Every day | ORAL | Status: DC
  Administered 2016-09-28 – 2016-09-29 (×2): 40 mg via ORAL
  Filled 2016-09-27 (×2): qty 1

## 2016-09-27 MED ORDER — GI COCKTAIL ~~LOC~~
30.0000 mL | Freq: Two times a day (BID) | ORAL | Status: DC | PRN
  Administered 2016-09-28: 30 mL via ORAL
  Filled 2016-09-27 (×3): qty 30

## 2016-09-27 MED ORDER — ENOXAPARIN SODIUM 40 MG/0.4ML ~~LOC~~ SOLN
40.0000 mg | SUBCUTANEOUS | Status: DC
  Administered 2016-09-29: 40 mg via SUBCUTANEOUS
  Filled 2016-09-27 (×3): qty 0.4

## 2016-09-27 NOTE — ED Provider Notes (Signed)
[facility]-EMERGENCY DEPT Provider Note   CSN: [id number] Arrival date & time: 09/27/16  1715   By signing my name below, I, Soijett Blue, attest that this documentation has been prepared under the direction and in the presence of Audry Pili, PA-C Electronically Signed: Soijett Blue, ED Scribe. 09/27/16. 6:52 PM.  History   Chief Complaint Chief Complaint  Patient presents with  . Abdominal Pain  . Emesis    HPI Francis Walters is a 26 y.o. male who presents to the Emergency Department complaining of intermittent RUQ abdominal pain onset 4 days ago. Pt reports associated nausea, red-tinged vomiting x 4 days ago following eating, dark yellow urine, and bilateral eye yellowing. Pt has tried OTC Alka-seltzer with no relief of his symptoms. Pt abdominal pain is worsened following eating and he last ate 1 hour ago. Denies diarrhea, fever, chills, and any other symptoms. Pt reports that he has had an appendectomy, but still has his gallbladder. Denies liver issues or drinking copious amounts of ETOH.   The history is provided by the patient. No language interpreter was used.    Past Medical History:  Diagnosis Date  . Bronchitis   . Environmental allergies     There are no active problems to display for this patient.   Past Surgical History:  Procedure Laterality Date  . APPENDECTOMY  04/27/12  . LAPAROSCOPIC APPENDECTOMY  04/26/2012   Procedure: APPENDECTOMY LAPAROSCOPIC;  Surgeon: Liz Malady, MD;  Location: [facility] OR;  Service: General;  Laterality: N/A;       Home Medications    Prior to Admission medications   Medication Sig Start Date End Date Taking? Authorizing Provider  benzonatate (TESSALON) 100 MG capsule Take 1 capsule (100 mg total) by mouth 3 (three) times daily as needed for cough. 11/07/12   Gerhard Munch, MD  ibuprofen (ADVIL,MOTRIN) 600 MG tablet Take 1 tablet (600 mg total) by mouth every 6 (six) hours as needed. 10/26/14   Jaynie Crumble, PA-C    Family  History No family history on file.  Social History Social History  Substance Use Topics  . Smoking status: Current Every Day Smoker    Packs/day: 0.50    Types: Cigarettes  . Smokeless tobacco: Never Used  . Alcohol use Yes     Comment: social     Allergies   Patient has no known allergies.   Review of Systems Review of Systems  Constitutional: Negative for chills and fever.  Eyes:       +bilateral eye yellowing  Gastrointestinal: Positive for abdominal pain, nausea and vomiting. Negative for diarrhea.  Genitourinary:       +Dark yellow urine   Physical Exam Updated Vital Signs BP 138/75 (BP Location: Left Arm)   Pulse 90   Temp 98.1 F (36.7 C)   Resp 18   Ht 6' (1.829 m)   Wt 185 lb (83.9 kg)   SpO2 99%   BMI 25.09 kg/m   Physical Exam  Constitutional: He is oriented to person, place, and time. He appears well-developed and well-nourished. No distress.  HENT:  Head: Normocephalic and atraumatic.  Eyes: EOM are normal. Scleral icterus is present.  Neck: Neck supple.  Cardiovascular: Normal rate.   Pulmonary/Chest: Effort normal. No respiratory distress.  Abdominal: He exhibits no distension. There is tenderness in the right upper quadrant. There is negative Murphy's sign.  Musculoskeletal: Normal range of motion.  Neurological: He is alert and oriented to person, place, and time.  Skin: Skin is warm  and dry.  Psychiatric: He has a normal mood and affect. His behavior is normal.  Nursing note and vitals reviewed.  ED Treatments / Results  DIAGNOSTIC STUDIES: Oxygen Saturation is 99% on RA, nl by my interpretation.    COORDINATION OF CARE: 5:54 PM Discussed treatment plan with pt at bedside which includes labs, UA, US abdomen limited, and pt agreed to plan.  7:18 PM-Consult with GI Specialist, who recommends admitting the pt to medicine and that they will follow up with the pt tomorrow.   7:20 PM- Consult with hospitalist, who recommends admittance to  the hospital.    Labs (all labs ordered are listed, but only abnormal results are displayed) Labs Reviewed  COMPREHENSIVE METABOLIC PANEL - Abnormal; Notable for the following:       Result Value   Chloride 98 (*)    AST 1,631 (*)    ALT 2,372 (*)    Total Bilirubin 7.7 (*)    All other components within normal limits  URINALYSIS, ROUTINE W REFLEX MICROSCOPIC - Abnormal; Notable for the following:    Color, Urine AMBER (*)    Bilirubin Urine MODERATE (*)    All other components within normal limits  PROTIME-INR - Abnormal; Notable for the following:    Prothrombin Time 16.1 (*)    All other components within normal limits  ACETAMINOPHEN LEVEL - Abnormal; Notable for the following:    Acetaminophen (Tylenol), Serum <10 (*)    All other components within normal limits  CBC WITH DIFFERENTIAL/PLATELET  LIPASE, BLOOD  HEPATITIS PANEL, ACUTE  MONONUCLEOSIS SCREEN  BILIRUBIN, FRACTIONATED(TOT/DIR/INDIR)   EKG  EKG Interpretation None      Radiology [place] Abdomen Limited Ruq  Result Date: 09/27/2016 CLINICAL DATA:  Right upper quadrant pain. EXAM: US ABDOMEN LIMITED - RIGHT UPPER QUADRANT COMPARISON:  None. FINDINGS: Gallbladder: The gallbladder is contracted as the patient ate within the last 2 hours. The gallbladder wall appears thickened measuring 4 mm. No Murphy's sign, stones, or sludge. Common bile duct: Diameter: 4 mm Liver: No focal lesion identified. Within normal limits in parenchymal echogenicity. IMPRESSION: Evaluation of the gallbladder is limited due to a recent meal. The gallbladder wall is mildly thickened measuring 4 mm but this could be due to poor distention. No stones, sludge, pericholecystic fluid, or Murphy's sign. A HIDA scan could further evaluate if clinically warranted. Electronically Signed   By: Gerome Samavid  Williams III M.D   On: 09/27/2016 18:38    Procedures Procedures (including critical care time)  Medications Ordered in ED Medications  ondansetron  [facility](ZOFRAN) injection 4 mg (4 mg Intravenous Given 09/27/16 1816)     Initial Impression / Assessment and Plan / ED Course  I have reviewed the triage vital signs and the nursing notes.  Pertinent labs & imaging results that were available during my care of the patient were reviewed by me and considered in my medical decision making (see chart for details).  I have reviewed and evaluated the relevant laboratory values. I have reviewed and evaluated the relevant imaging studies. I have reviewed the relevant previous healthcare records.  I obtained HPI from historian. Patient discussed with supervising physician.  ED Course:  Assessment: Pt is a 26 y.o. male  who presents with RUQ discomfort as well as N/V x 4 days. Worsened with PO intake. No fevers. On exam, pt in NAD. Nontoxic/nonseptic appearing. VSS. Afebrile. Lungs CTA. Heart RRR. Abdomen with mild RUQ tenderness. Noted scleral icterus. Concern for CBD obstruction from possible stone vs hepatitis.  UA with bili.  CBC unremarkable. CMP with with AST 1600s and ALT 2300s. Bili 7.7. Ordered tylenol level as well as Hepatitis panel. RUQ [place] limited due to recent meal in the last 2 hours. Gallbladder mildly thickened 4mm. NO stones or sludge. Negative murphy's. Pt denies recent travel. No ETOH abuse. No tylenol abuse. Plan is to Admit to medicine. Consult to GI placed who will see in AM.    Disposition/Plan:  Admit Pt acknowledges and agrees with plan  Supervising Physician Shaune Pollack, MD  Final Clinical Impressions(s) / ED Diagnoses   Final diagnoses:  RUQ pain  Hepatitis  Elevated bilirubin    New Prescriptions New Prescriptions   No medications on file    I personally performed the services described in this documentation, which was scribed in my presence. The recorded information has been reviewed and is accurate.     Audry Pili, PA-C 09/27/16 2055    Shaune Pollack, MD 09/29/16 [phone]

## 2016-09-27 NOTE — ED Triage Notes (Signed)
Pt c/o nausea and vomiting with abd pain for 4 days.  Also st's he noticed that his eyes were yellow this am.  Pt denies diarrhea

## 2016-09-27 NOTE — Progress Notes (Signed)
Patient arrived to floor 6N. Alert and oriented x4. Patient stable. Report received from [place], [place].

## 2016-09-27 NOTE — ED Notes (Signed)
PA calling admitting in regards to bed request orders.

## 2016-09-27 NOTE — H&P (Signed)
[facility] Admission History and Physical Service Pager: [phone]  Patient name: Francis Walters Medical record number: [id number] Date of birth: 06-10-1991 Age: 26 y.o. Gender: male  Primary Care Provider: Default, Provider, MD Consultants: Gastroenterology  Code Status: Full code  Chief Complaint: Abdominal pain  Assessment and Plan: Francis Walters is a 26 y.o. male presenting with 4 days of abdominal pain, indigestion and nausea noted to have elevated transaminases and hyperbilirubinemia with jaundice.  PMH: hx of appendectomy   Abdominal pain with elevated transaminases and hyperbilirubinemia:  Reports four days of intermittent nausea, indigestion and abdominal pain worse at the epigastric>RUQ.  Vomited x 4 at night after going to sleep, dark red in color, with possible hematemesis. This could represent GERD vs: peptic ulcer disease.  Denies significant alcohol use, though has been bing drinking once week for the past month which is not his normal.  Recently ill with URI and differential showing atypical lymphocytes. Could possibly represent infectious mononucleosis, however no pharyngitis or pharyngeal exudates making this less likely. Mild jaundice noted under tongue, possibly on skin and mild scleral icterus and also reports dark urine x4 days. AST/ALT elevated to 1,631/2372 and Tbili elevated to 7.7. Alk phos WNL. Lipase normal.  Elevation of ALT>AST makes alcoholic hepatitis less likely.  RUQ US showed no sonographic murphy's, but did show 79m thickened gallbladder without obvious stones, no cirrhotic changes to the liver. No leukocytosis, no fevers or chills. Unlikely to be biliary obstruction or cholangitis, though could be acalculous cholecysititis. Patient did report unprotected sexual activity 2-3 mo ago with woman known to be an IVDU.  Given degree of elevation of liver enzymes, this could represent viral hepatitis. Additionally, has a history of incarceration  for one week over a year ago.  Tylenol level <10, making acetaminophen ingestion a source of jaundice impossible, denies any SI. GI consulted who see patient in AM. No family history of liver disease. Will rule out hemachromatosis, may consider other cause of elevated transaminates including autoimmune, wilson's disease, congential such as Gilbert's  - Admit to FMTS for observation under attending MDilley- GI consulted; appreciate recommendations -  f/u fractionated bilirubin - Consider HIDA scan per GI recommendations  - AM CMP/CBC - Zofran PRN nausea - Protonix 492mfor possibly Ulcer  - GI cocktail - Iron panel r/u hemachromatosis  - Peripheral smear due presence of atypical lymphocytes  - NPO at midnight - HIV and Urine GC/chlamydia and Trichomonas  - Consider work up for autoimmune hepatitis if hepatitis panel negative   FEN/GI: regular diet; NPO at midnight; protonix Prophylaxis: lovenox  Disposition: admit for observation with DC pending clinical improvement.   History of Present Illness:  Francis FOOTSs a [age].o. male presenting with nausea x4 days worse with food and also endorses early satiety and vomiting with possible hematemesis.  Symptoms feels like a "burp" that starts in his abdomen and never makes its way up. Two weeks ago had upper respiratory infection with productive cough with green sputum.  Denies diarrhea. Reports sexual activity 3-4 mo ago without protection aside from current relationship with woman known to be IVDU.  Patient denies any personal history of IVDU. Denies recent travel or excessive ingestion of tylenol (reports taking 3 tylenol pills a couple of times). No SI. Also reports dark urine for the past four days. Did note increased alcohol consumption over past few weeks on the weekends. Denies CP, SOB, chills, fevers, nausea, vomiting or diarrhea.  Review Of Systems: Per HPI   Review of Systems  Constitutional: Negative for chills and fever.  HENT:  Negative for congestion and sore throat.   Eyes: Negative for blurred vision and pain.  Respiratory: Negative for cough and shortness of breath.   Cardiovascular: Negative for chest pain and leg swelling.  Gastrointestinal: Positive for abdominal pain, heartburn, nausea and vomiting. Negative for diarrhea.  Genitourinary: Negative for dysuria, frequency and urgency.  Musculoskeletal: Negative for myalgias.  Skin: Negative for rash.  Psychiatric/Behavioral: Negative for depression and suicidal ideas.    There are no active problems to display for this patient.   Past Medical History: Past Medical History:  Diagnosis Date  . Bronchitis   . Environmental allergies     Past Surgical History: Past Surgical History:  Procedure Laterality Date  . APPENDECTOMY  04/27/12  . LAPAROSCOPIC APPENDECTOMY  04/26/2012   Procedure: APPENDECTOMY LAPAROSCOPIC;  Surgeon: Zenovia Jarred, MD;  Location: [facility] OR;  Service: General;  Laterality: N/A;    Social History: Social History  Substance Use Topics  . Smoking status: Current Every Day Smoker    Packs/day: 0.50    Types: Cigarettes  . Smokeless tobacco: Never Used  . Alcohol use Yes     Comment: social   Family History: No family history on file. Mom- T2DM  Allergies and Medications: No Known Allergies No current facility-administered medications on file prior to encounter.    Current Outpatient Prescriptions on File Prior to Encounter  Medication Sig Dispense Refill  . benzonatate (TESSALON) 100 MG capsule Take 1 capsule (100 mg total) by mouth 3 (three) times daily as needed for cough. 15 capsule 0  . ibuprofen (ADVIL,MOTRIN) 600 MG tablet Take 1 tablet (600 mg total) by mouth every 6 (six) hours as needed. 30 tablet 0    Objective: BP 138/75 (BP Location: Left Arm)   Pulse 90   Temp 98.1 F (36.7 C)   Resp 18   Ht 6' (1.829 m)   Wt 185 lb (83.9 kg)   SpO2 99%   BMI 25.09 kg/m  Exam: General: 26 year old male sitting  up in bed and appearing comfortable Eyes: EOMI, PERRL, mild scleral icterus and noninjected  ENTM: Mild jaundice under tongue, clear oropharynx and moist mucous membranes Neck: Supple, spotty lymphadenopathy Cardiovascular: RRR, no MRG, 2+ palpable pulses Respiratory: NWOB, CTABL, no wheezing or rhonchi Gastrointestinal: Soft, moderately tender epigastrium and RUQ with negative murphys sign, without peritoneal signs, liver non-palpable, no palpable masses apparent MSK: no gross deformities, no edema Derm: possible jaundice, no new rashes, skin is warm and dry Neuro: AAOx3, CN2-12 WNL, sensation intact, strength 5/5 Psych: Normal mood and affect  Labs and Imaging: CBC BMET   Recent Labs Lab 09/27/16 1729  WBC 8.2  HGB 16.2  HCT 47.0  PLT 234    Recent Labs Lab 09/27/16 1729  NA 136  K 3.9  CL 98*  CO2 27  BUN 9  CREATININE 1.02  GLUCOSE 66  CALCIUM 9.5     Hepatic Function Latest Ref Rng & Units 09/27/2016  Total Protein 6.5 - 8.1 g/dL 7.0  Albumin 3.5 - 5.0 g/dL 3.8  AST 15 - 41 U/L 1,631(H)  ALT 17 - 63 U/L 2,372(H)  Alk Phosphatase 38 - 126 U/L 122  Total Bilirubin 0.3 - 1.2 mg/dL 7.7(H)     Eloise Levels, MD 09/27/2016, 7:31 PM PGY-1, [place] Intern pager: [phone], text pages welcome   UPPER LEVEL ADDENDUM  I have read the above note and made revisions highlighted in blue.  Kerrin Mo, MD, PGY-2 [facility]

## 2016-09-27 NOTE — ED Notes (Signed)
Attempted to call report

## 2016-09-27 NOTE — ED Notes (Signed)
Patient transported to Ultrasound 

## 2016-09-28 DIAGNOSIS — R17 Unspecified jaundice: Secondary | ICD-10-CM | POA: Diagnosis not present

## 2016-09-28 DIAGNOSIS — R1011 Right upper quadrant pain: Secondary | ICD-10-CM | POA: Diagnosis not present

## 2016-09-28 LAB — ETHANOL: Alcohol, Ethyl (B): <5 mg/dL (ref <5)

## 2016-09-28 LAB — CBC
HCT: 46.4 % (ref 39.0–52.0)
Hemoglobin: 15.4 g/dL (ref 13.0–17.0)
MCH: 27 pg (ref 26.0–34.0)
MCHC: 33.2 g/dL (ref 30.0–36.0)
MCV: 81.4 fL (ref 78.0–100.0)
PLATELETS: 229 10*3/uL (ref 150–400)
RBC: 5.7 MIL/uL (ref 4.22–5.81)
RDW: 13.8 % (ref 11.5–15.5)
WBC: 8.6 10*3/uL (ref 4.0–10.5)

## 2016-09-28 LAB — COMPREHENSIVE METABOLIC PANEL
ALT: 2327 U/L — AB (ref 17–63)
AST: 1482 U/L — ABNORMAL HIGH (ref 15–41)
Albumin: 4.1 g/dL (ref 3.5–5.0)
Alkaline Phosphatase: 133 U/L — ABNORMAL HIGH (ref 38–126)
Anion gap: 12 (ref 5–15)
BUN: 8 mg/dL (ref 6–20)
CHLORIDE: 95 mmol/L — AB (ref 101–111)
CO2: 29 mmol/L (ref 22–32)
CREATININE: 1.01 mg/dL (ref 0.61–1.24)
Calcium: 9.7 mg/dL (ref 8.9–10.3)
Glucose, Bld: 96 mg/dL (ref 65–99)
Potassium: 3.5 mmol/L (ref 3.5–5.1)
Sodium: 136 mmol/L (ref 135–145)
TOTAL PROTEIN: 7.3 g/dL (ref 6.5–8.1)
Total Bilirubin: 7.7 mg/dL — ABNORMAL HIGH (ref 0.3–1.2)

## 2016-09-28 LAB — IRON AND TIBC
IRON: 214 ug/dL — AB (ref 45–182)
SATURATION RATIOS: 50 % — AB (ref 17.9–39.5)
TIBC: 424 ug/dL (ref 250–450)
UIBC: 210 ug/dL

## 2016-09-28 LAB — BILIRUBIN, FRACTIONATED(TOT/DIR/INDIR)
BILIRUBIN DIRECT: 4.3 mg/dL — AB (ref 0.1–0.5)
BILIRUBIN INDIRECT: 2.5 mg/dL — AB (ref 0.3–0.9)
Total Bilirubin: 6.8 mg/dL — ABNORMAL HIGH (ref 0.3–1.2)

## 2016-09-28 LAB — PROTIME-INR
INR: 1.19
PROTHROMBIN TIME: 15.2 s (ref 11.4–15.2)

## 2016-09-28 LAB — FERRITIN: Ferritin: 564 ng/mL — ABNORMAL HIGH (ref 24–336)

## 2016-09-28 LAB — TSH: TSH: 0.987 u[IU]/mL (ref 0.350–4.500)

## 2016-09-28 LAB — MONONUCLEOSIS SCREEN: Mono Screen: NEGATIVE

## 2016-09-28 LAB — HIV ANTIBODY (ROUTINE TESTING W REFLEX): HIV Screen 4th Generation wRfx: NONREACTIVE

## 2016-09-28 LAB — TRANSFERRIN: Transferrin: 303 mg/dL (ref 180–329)

## 2016-09-28 MED ORDER — FOLIC ACID 1 MG PO TABS
1.0000 mg | ORAL_TABLET | Freq: Every day | ORAL | Status: DC
  Administered 2016-09-28 – 2016-09-29 (×2): 1 mg via ORAL
  Filled 2016-09-28 (×2): qty 1

## 2016-09-28 MED ORDER — ONDANSETRON HCL 4 MG/2ML IJ SOLN: 4.0000 mg | Freq: Three times a day (TID) | INTRAMUSCULAR | Status: DC | PRN

## 2016-09-28 MED ORDER — LORAZEPAM 1 MG PO TABS: 1.0000 mg | ORAL_TABLET | Freq: Four times a day (QID) | ORAL | Status: DC | PRN

## 2016-09-28 MED ORDER — LORAZEPAM 2 MG/ML IJ SOLN: 1.0000 mg | Freq: Four times a day (QID) | INTRAMUSCULAR | Status: DC | PRN

## 2016-09-28 MED ORDER — ADULT MULTIVITAMIN W/MINERALS CH
1.0000 | ORAL_TABLET | Freq: Every day | ORAL | Status: DC
  Administered 2016-09-28 – 2016-09-29 (×2): 1 via ORAL
  Filled 2016-09-28 (×2): qty 1

## 2016-09-28 MED ORDER — THIAMINE HCL 100 MG/ML IJ SOLN: 100.0000 mg | Freq: Every day | INTRAMUSCULAR | Status: DC

## 2016-09-28 MED ORDER — VITAMIN B-1 100 MG PO TABS
100.0000 mg | ORAL_TABLET | Freq: Every day | ORAL | Status: DC
  Administered 2016-09-28 – 2016-09-29 (×2): 100 mg via ORAL
  Filled 2016-09-28 (×2): qty 1

## 2016-09-28 NOTE — Consult Note (Signed)
Referring Provider: Dr. Pollie MeyerMcIntyre Primary Care Physician:  Default, Provider, MD Primary Gastroenterologist:  UNASSIGNED  Reason for Consultation:  Elevated LFTs; Abdominal pain  HPI: Francis Walters is a 26 y.o. male presents with markedly elevated LFTs without any known history of viral hepatitis. Denies any previous jaundice or elevated liver enzymes. Occasional alcohol use but increased ingestion during the past few weekends. Has had unprotected sex with a woman 2-3 months ago with a woman who uses IV drugs. He denies any personal use of IV drugs and denies cocaine or heroin. Uses marijuana occasionally. Denies Tylenol or NSAIDs. Denies herbal therapies or mushrooms. Mono test negative. U/S negative for gallstones and negative for pericholecystic fluid or Murphy's sign. TB 7.7 (DB 4.3), ALP 122, AST 1,631, ALT 2,372. INR 1.28. Lipase 12. Platelets 234. Felt bloated and nauseous this past week. Frequent belching as well. Denies abdominal pain, F/C/vomiting/melena/hematochezia. Girlfriend at bedside.  Past Medical History:  Diagnosis Date  . Bronchitis   . Environmental allergies     Past Surgical History:  Procedure Laterality Date  . APPENDECTOMY  04/27/12  . LAPAROSCOPIC APPENDECTOMY  04/26/2012   Procedure: APPENDECTOMY LAPAROSCOPIC;  Surgeon: Liz MaladyBurke E Thompson, MD;  [facility];  Service: General;  Laterality: N/A;    Prior to Admission medications   Medication Sig Start Date End Date Taking? Authorizing Provider  ibuprofen (ADVIL,MOTRIN) 200 MG tablet Take 600 mg by mouth every 6 (six) hours as needed for headache (pain).   Yes Historical Provider, MD  benzonatate (TESSALON) 100 MG capsule Take 1 capsule (100 mg total) by mouth 3 (three) times daily as needed for cough. Patient not taking: Reported on 09/27/2016 11/07/12   Gerhard Munchobert Lockwood, MD  ibuprofen (ADVIL,MOTRIN) 600 MG tablet Take 1 tablet (600 mg total) by mouth every 6 (six) hours as needed. Patient not taking: Reported on  09/27/2016 10/26/14   Jaynie Crumbleatyana Kirichenko, PA-C    Scheduled Meds: . enoxaparin (LOVENOX) injection  40 mg Subcutaneous Q24H  . pantoprazole  40 mg Oral Daily   Continuous Infusions: PRN Meds:.gi cocktail, ondansetron (ZOFRAN) IV  Allergies as of 09/27/2016  . (No Known Allergies)    No family history on file.  Social History   Social History  . Marital status: Single    Spouse name: N/A  . Number of children: N/A  . Years of education: N/A   Occupational History  . Not on file.   Social History Main Topics  . Smoking status: Current Every Day Smoker    Packs/day: 0.50    Types: Cigarettes  . Smokeless tobacco: Never Used  . Alcohol use Yes     Comment: social  . Drug use: No  . Sexual activity: Not on file   Other Topics Concern  . Not on file   Social History Narrative  . No narrative on file    Review of Systems: All negative except as stated above in HPI.  Physical Exam: Vital signs: Vitals:   09/27/16 2330 09/28/16 0431  BP: 126/60 123/60  Pulse: 77 88  Resp: 19 19  Temp: 98.8 F (37.1 C) 98.3 F (36.8 C)   Last BM Date: 09/26/16 General:   Alert, Well-developed, well-nourished, no acute distress, multiple tattoos HEENT: +scleral icterus, oropharynx clear Lungs:  Clear throughout to auscultation.   No wheezes, crackles, or rhonchi. No acute distress. Heart:  Regular rate and rhythm; no murmurs, clicks, rubs,  or gallops. Abdomen: RUQ and epigastric tenderness with guarding, soft, nondistended, +BS  Rectal:  Deferred Ext: no edema  GI:  Lab Results:  Recent Labs  09/27/16 1729 09/28/16 0039  WBC 8.2 8.6  HGB 16.2 15.4  HCT 47.0 46.4  PLT 234 229   BMET  Recent Labs  09/27/16 1729 09/28/16 0039  NA 136 136  K 3.9 3.5  CL 98* 95*  CO2 27 29  GLUCOSE 66 96  BUN 9 8  CREATININE 1.02 1.01  CALCIUM 9.5 9.7   LFT  Recent Labs  09/28/16 0039  PROT 7.3  ALBUMIN 4.1  AST 1,482*  ALT 2,327*  ALKPHOS 133*  BILITOT 7.7*  6.8*   BILIDIR 4.3*  IBILI 2.5*   PT/INR  Recent Labs  09/27/16 1814 09/28/16 0039  LABPROT 16.1* 15.2  INR 1.28 1.19     Studies/Results: US Abdomen Limited Ruq  Result Date: 09/27/2016 CLINICAL DATA:  Right upper quadrant pain. EXAM: US ABDOMEN LIMITED - RIGHT UPPER QUADRANT COMPARISON:  None. FINDINGS: Gallbladder: The gallbladder is contracted as the patient ate within the last 2 hours. The gallbladder wall appears thickened measuring 4 mm. No Murphy's sign, stones, or sludge. Common bile duct: Diameter: 4 mm Liver: No focal lesion identified. Within normal limits in parenchymal echogenicity. IMPRESSION: Evaluation of the gallbladder is limited due to a recent meal. The gallbladder wall is mildly thickened measuring 4 mm but this could be due to poor distention. No stones, sludge, pericholecystic fluid, or Murphy's sign. A HIDA scan could further evaluate if clinically warranted. Electronically Signed   By: Gerome Sam III M.D   On: 09/27/2016 18:38    Impression/Plan: Acute hepatitis - likely acute Hepatitis B (hep panel pending). He has risk factors for acute Hep B and ALT > AST at these levels is typically seen in acute Hep B. I do not think his gallbladder is the source. No signs of hepatic failure. Start full liquids and advance. Once I discussed ways to contract Hep B, he and his girlfriend were very worried about unprotected sex and wanted to know how long it would take to clear the Hep B if he had it. Answered their questions. Ok to d/c when LFTs start trending down. Would keep him another day to follow his labs because highly likely that he will not be compliant with f/u.    LOS: 0 days   Amylah Will C.  09/28/2016, 10:15 AM  Pager [phone]  AFTER 5 pm or on weekends please call [phone]

## 2016-09-28 NOTE — Progress Notes (Signed)
Family Medicine Teaching Service Daily Progress Note Intern Pager: [phone]  Patient name: Francis Walters Medical record number: [id number] Date of birth: 21-Apr-1991 Age: 26 y.o. Gender: male  Primary Care Provider: Default, Provider, MD Consultants: Gastroenterology Code Status: Full code  Pt Overview and Major Events to Date:  1. Admit to FMTS  Assessment and Plan: Francis Walters is a 26 y.o. male presenting with 4 days of abdominal pain, indigestion and nausea noted to have elevated transaminases and hyperbilirubinemia with jaundice.  PMH: hx of appendectomy   Abdominal pain with elevated transaminases and hyperbilirubinemia:  Reports four days of intermittent nausea, indigestion and abdominal pain worse at the epigastric>RUQ. Did have URI recently and diff showing atypical lymphocytes, but monospot is negative.  Mild jaundice noted under tongue, possibly on skin and mild scleral icterus and also reports dark urine x4 days.   Denies significant alcohol use, though has been binge drinking once week for the past month which is not his normal. AST/ALT elevated to 1,631/2372 and now down to 1482/232 and Tbili initially elevated to 7.7 now down to 6.8 and fractionated to Dbili of 4.3 and Indirect bili 2.5. Alk phos very mildly elevated to 133.  Lipase normal.  Elevation of ALT>AST makes alcoholic hepatitis less likely.  RUQ US showed no sonographic murphy's, but did show 61m thickened gallbladder without obvious stones, no cirrhotic changes to the liver. No leukocytosis, no fevers or chills. Unlikely to be biliary obstruction or cholangitis.  Reported unprotected sex with known IVDU. Picture could very likely be viral hepatitis, probably hepatitis B given that he has no personal history of IVDU. Additionally, has a history of incarceration for one week over a year ago.  No FH of liver disease.  Seen by GI who feels that he likely has acute hepatitis B and are recommending DC when LFTs trend downward.  -  GI consulted; appreciate recommendations - f/u hepatitis panel - Zofran PRN nausea - Protonix 468mfor possibly Ulcer  - GI cocktail - Iron panel r/u hemachromatosis  - Liquid diet - HIV and Urine GC/chlamydia and Trichomonas  - Consider work up for autoimmune hepatitis if hepatitis panel negative  - Check epstein bar if viral panel neg  FEN/GI: Full liquid diet Prophylaxis: lovenox  Disposition: admit for observation with DC pending clinical improvement.   Subjective:  Feels well this morning, did have some abd pain last night without vomiting. Had a GI cocktail with good relief.   Objective: Temp:  [98.1 F (36.7 C)-98.8 F (37.1 C)] 98.3 F (36.8 C) (03/26 0431) Pulse Rate:  [70-90] 88 (03/26 0431) Resp:  [18-19] 19 (03/26 0431) BP: (119-138)/(60-75) 123/60 (03/26 0431) SpO2:  [99 %-100 %] 100 % (03/26 0431) Weight:  [185 lb (83.9 kg)] 185 lb (83.9 kg) (03/25 1725) Physical Exam: General: 26yo sitting up in bed appearing comfortable in in NAD Eyes: mild scleral icterus Cardiovascular: RRR, no MRG Respiratory: NWOB, CTABL, no wheezing or rhonchi Abdomen: soft, mod epigastric and RUQ tednerness without murphys sign and no peritoneal signs. No palpable masses Skin: possible some mild jaundice, warm and dry, no new rashes Extremities: no LE edema  Laboratory:  Recent Labs Lab 09/27/16 1729 09/28/16 0039  WBC 8.2 8.6  HGB 16.2 15.4  HCT 47.0 46.4  PLT 234 229    Recent Labs Lab 09/27/16 1729 09/28/16 0039  NA 136 136  K 3.9 3.5  CL 98* 95*  CO2 27 29  BUN 9 8  CREATININE 1.02 1.01  CALCIUM 9.5 9.7  PROT 7.0 7.3  BILITOT 7.7* 7.7*  6.8*  ALKPHOS 122 133*  ALT 2,372* 2,327*  AST 1,631* 1,482*  GLUCOSE 66 96    Imaging/Diagnostic Tests: US Abdomen Limited Ruq  Result Date: 09/27/2016 CLINICAL DATA:  Right upper quadrant pain. EXAM: US ABDOMEN LIMITED - RIGHT UPPER QUADRANT COMPARISON:  None. FINDINGS: Gallbladder: The gallbladder is contracted  as the patient ate within the last 2 hours. The gallbladder wall appears thickened measuring 4 mm. No Murphy's sign, stones, or sludge. Common bile duct: Diameter: 4 mm Liver: No focal lesion identified. Within normal limits in parenchymal echogenicity. IMPRESSION: Evaluation of the gallbladder is limited due to a recent meal. The gallbladder wall is mildly thickened measuring 4 mm but this could be due to poor distention. No stones, sludge, pericholecystic fluid, or Murphy's sign. A HIDA scan could further evaluate if clinically warranted. Electronically Signed   By: Dorise Bullion III M.D   On: 09/27/2016 18:38    Eloise Levels, MD 09/28/2016, 8:54 AM PGY-1, [place] Intern pager: [phone], text pages welcome

## 2016-09-29 DIAGNOSIS — R768 Other specified abnormal immunological findings in serum: Secondary | ICD-10-CM

## 2016-09-29 DIAGNOSIS — R17 Unspecified jaundice: Secondary | ICD-10-CM | POA: Diagnosis not present

## 2016-09-29 DIAGNOSIS — R1011 Right upper quadrant pain: Secondary | ICD-10-CM | POA: Diagnosis not present

## 2016-09-29 LAB — HEPATITIS PANEL, ACUTE
HEP A IGM: NEGATIVE
Hep B C IgM: NEGATIVE
Hepatitis B Surface Ag: NEGATIVE

## 2016-09-29 LAB — HEPATIC FUNCTION PANEL
ALBUMIN: 3.4 g/dL — AB (ref 3.5–5.0)
ALK PHOS: 126 U/L (ref 38–126)
ALT: 1568 U/L — ABNORMAL HIGH (ref 17–63)
AST: 815 U/L — ABNORMAL HIGH (ref 15–41)
BILIRUBIN TOTAL: 3.6 mg/dL — AB (ref 0.3–1.2)
Bilirubin, Direct: 2 mg/dL — ABNORMAL HIGH (ref 0.1–0.5)
Indirect Bilirubin: 1.6 mg/dL — ABNORMAL HIGH (ref 0.3–0.9)
Total Protein: 6.2 g/dL — ABNORMAL LOW (ref 6.5–8.1)

## 2016-09-29 NOTE — Progress Notes (Signed)
Patient expressed to RN that he was ready to leave the hospital. RN explained that there was not yet a discharge order but the MD would be paged. RN expressed to the patient that if he was in a rush and refused to wait, an AMA form would need to be signed. Patient verbalized understanding. RN paged, MD. Per MD they would be up in roughly 20 minutes to talk to the patient and give him some new test results. RN went back to patients room to relay information, patient was not present in the room. RN walked outside of the unit to look for the patient. Patient was not found. RN called the numbers listed in the chart, no answer. MD made aware.

## 2016-09-29 NOTE — Progress Notes (Signed)
Family Medicine Teaching Service Daily Progress Note Intern Pager: [phone]  Patient name: Francis Bunkerustin D Lapier Medical record number: [id number] Date of birth: 03/26/1991 Age: 26 y.o. Gender: male  Primary Care Provider: Default, Provider, MD Consultants: Gastroenterology Code Status: Full code  Pt Overview and Major Events to Date:   Assessment and Plan: Francis Walters is a 26 y.o. male presenting with 4 days of abdominal pain, indigestion and nausea noted to have elevated transaminases and hyperbilirubinemia with jaundice.  PMH: hx of appendectomy   Acute hepatitis C:  Hep C ab elevated to >11 and remainder of hepatitis panel negative. LFTs trending downward this morning with AST/ALT to 815 and 1,568 from 1,631 and 2,372 yesterday.   Additionally, bilirubin also trending downward. Abd symptoms are resolving.  Discussed with Gastroenterologist, Dr. Bosie ClosSchooler who feels pt is appropriate for DC given down trending LFTs. Spoke with ID physician Dr. Orvan Falconerampbell about drawing labs for ID outpatient visit and he stated that that will obtain those at that time.  - hep C RNA and repeat in 4 weeks - GI consulted; appreciate recommendations - Protonix 40mg  for possibly Ulcer  - GI cocktail - Liquid diet - HIV and Urine GC/chlamydia and Trichomonas   FEN/GI: Full liquid diet Prophylaxis: lovenox  Disposition: LFTs downtrending, should be appropriate for DC today  Subjective:  Abd pain improving today.  Very upset over positive hep c ab.  Denies CP, SOB, nausea vomiting or diarrhea.   Objective: Temp:  [98.5 F (36.9 C)-98.7 F (37.1 C)] 98.7 F (37.1 C) (03/27 0551) Pulse Rate:  [62-77] 77 (03/27 0551) Resp:  [19] 19 (03/27 0551) BP: (98-127)/(64-66) 125/66 (03/27 0551) SpO2:  [100 %] 100 % (03/27 0551) Physical Exam: General: 25yo M sitting up in bed appearing comfortable in in NAD Eyes: mild scleral icterus  Cardiovascular: RRR, no MRG Respiratory: NWOB, CTABL, no wheezing or  rhonchi Abdomen: soft, mod epigastric and RUQ tednerness without murphys sign and no peritoneal signs. No palpable masses Skin: soft and dry Extremities: no LE edema  Laboratory:  Recent Labs Lab 09/27/16 1729 09/28/16 0039  WBC 8.2 8.6  HGB 16.2 15.4  HCT 47.0 46.4  PLT 234 229    Recent Labs Lab 09/27/16 1729 09/28/16 0039 09/29/16 0255  NA 136 136  --   K 3.9 3.5  --   CL 98* 95*  --   CO2 27 29  --   BUN 9 8  --   CREATININE 1.02 1.01  --   CALCIUM 9.5 9.7  --   PROT 7.0 7.3 6.2*  BILITOT 7.7* 7.7*  6.8* 3.6*  ALKPHOS 122 133* 126  ALT 2,372* 2,327* 1,568*  AST 1,631* 1,482* 815*  GLUCOSE 66 96  --     Imaging/Diagnostic Tests: [place] Abdomen Limited Ruq  Result Date: 09/27/2016 CLINICAL DATA:  Right upper quadrant pain. EXAM: US ABDOMEN LIMITED - RIGHT UPPER QUADRANT COMPARISON:  None. FINDINGS: Gallbladder: The gallbladder is contracted as the patient ate within the last 2 hours. The gallbladder wall appears thickened measuring 4 mm. No Murphy's sign, stones, or sludge. Common bile duct: Diameter: 4 mm Liver: No focal lesion identified. Within normal limits in parenchymal echogenicity. IMPRESSION: Evaluation of the gallbladder is limited due to a recent meal. The gallbladder wall is mildly thickened measuring 4 mm but this could be due to poor distention. No stones, sludge, pericholecystic fluid, or Murphy's sign. A HIDA scan could further evaluate if clinically warranted. Electronically Signed   By: Onalee Huaavid  Judithe Modest M.D   On: 09/27/2016 18:38    Renne Musca, MD 09/29/2016, 9:08 AM PGY-1, [facility] Family Medicine FPTS Intern pager: [phone], text pages welcome

## 2016-10-01 NOTE — Discharge Summary (Signed)
Family Medicine Teaching [facility] Discharge Summary  Patient name: Francis Walters Medical record number: [id number] Date of birth: Sep 05, 1990 Age: 26 y.o. Gender: male Date of Admission: 09/27/2016  Date of Discharge: 09/29/2016 Admitting Physician: Latrelle Dodrill, MD  Primary Care Provider: Default, Provider, MD Consultants: Gastroenterology  Indication for Hospitalization: Abdominal pain and vomiting  Discharge Diagnoses/Problem List:  Positive hepatitis C antibody  Disposition: Left hospital AGAINST MEDICAL ADVICE  Discharge Condition: Stable  Discharge Exam:  General: 25yo M sitting up in bed appearing comfortable in in NAD Eyes: mild scleral icterus  Cardiovascular: RRR, no MRG Respiratory: NWOB, CTABL, no wheezing or rhonchi Abdomen: soft, mod epigastric and RUQ tednerness without murphys sign and no peritoneal signs. No palpable masses Skin: soft and dry Extremities: no LE edema  Brief Hospital Course:  Patient presented to [facility] ED with mild scleral icterus, jaundice and AST/ALT to 1631 and 2372 and bilirubin greater than 7 and abdominal pain without peritoneal signs or Murphy sign. RUQ US showed that the gallbladder wall was mildly thickened measuring 4 mm but this could be due to poor distention. Gastroenterology was consulted who felt this was likely acute hepatitis. Patient was given Protonix 40 mg, GI cocktail for symptom management. Hepatitis panel revealed hepatitis C antibody elevated greater than 11 with the remainder of the hepatitis panel negative. HIV antibody was also negative.  LFTs were trending downward the next morning and patient was appropriate for discharge per gastroenterology. Discussed case with infectious disease physician Dr. Orvan Falconer who recommended follow-up with patient at infectious disease clinic. Placed order for hepatitis C RNA Quant and GC chlamydia and planned to schedule appointment for patient, but patient AGAINST MEDICAL ADVICE.    Issues for Follow Up:  1. Hepatitis C: Patient should've stayed to have blood drawn for hepatitis C RNA Quant and ideally would have been able to check follow-up levels at 4 weeks. Additionally should have follow-up in ID clinic to investigate possible treatment.  Significant Procedures: None  Significant Labs and Imaging:   Recent Labs Lab 09/27/16 1729 09/28/16 0039  WBC 8.2 8.6  HGB 16.2 15.4  HCT 47.0 46.4  PLT 234 229    Recent Labs Lab 09/27/16 1729 09/28/16 0039 09/29/16 0255  NA 136 136  --   K 3.9 3.5  --   CL 98* 95*  --   CO2 27 29  --   GLUCOSE 66 96  --   BUN 9 8  --   CREATININE 1.02 1.01  --   CALCIUM 9.5 9.7  --   ALKPHOS 122 133* 126  AST 1,631* 1,482* 815*  ALT 2,372* 2,327* 1,568*  ALBUMIN 3.8 4.1 3.4*   Results/Tests Pending at Time of Discharge: none  Discharge Medications:  Allergies as of 09/29/2016   No Known Allergies     Medication List    ASK your doctor about these medications   benzonatate 100 MG capsule Commonly known as:  TESSALON Take 1 capsule (100 mg total) by mouth 3 (three) times daily as needed for cough.   ibuprofen 200 MG tablet Commonly known as:  ADVIL,MOTRIN Take 600 mg by mouth every 6 (six) hours as needed for headache (pain).   ibuprofen 600 MG tablet Commonly known as:  ADVIL,MOTRIN Take 1 tablet (600 mg total) by mouth every 6 (six) hours as needed.       Discharge Instructions: Please refer to Patient Instructions section of EMR for full details.  Patient was counseled important signs and  symptoms that should prompt return to medical care, changes in medications, dietary instructions, activity restrictions, and follow up appointments.   Follow-Up Appointments:   Renne Muscaaniel L Morgen Linebaugh, MD 10/01/2016, 6:21 PM PGY-1, [facility]

## 2016-11-09 ENCOUNTER — Emergency Department (HOSPITAL_COMMUNITY)
Admission: EM | Admit: 2016-11-09 | Discharge: 2016-11-10 | Disposition: A | Discharge status: Home / Self Care | Payer: Self-pay | Attending: Emergency Medicine | Admitting: Emergency Medicine

## 2016-11-09 ENCOUNTER — Encounter (HOSPITAL_COMMUNITY): Payer: Self-pay | Admitting: Vascular Surgery

## 2016-11-09 DIAGNOSIS — S0990XA Unspecified injury of head, initial encounter: Secondary | ICD-10-CM | POA: Insufficient documentation

## 2016-11-09 DIAGNOSIS — Y939 Activity, unspecified: Secondary | ICD-10-CM | POA: Insufficient documentation

## 2016-11-09 DIAGNOSIS — Y999 Unspecified external cause status: Secondary | ICD-10-CM | POA: Insufficient documentation

## 2016-11-09 DIAGNOSIS — W228XXA Striking against or struck by other objects, initial encounter: Secondary | ICD-10-CM | POA: Insufficient documentation

## 2016-11-09 DIAGNOSIS — Z5321 Procedure and treatment not carried out due to patient leaving prior to being seen by health care provider: Secondary | ICD-10-CM | POA: Insufficient documentation

## 2016-11-09 DIAGNOSIS — Y929 Unspecified place or not applicable: Secondary | ICD-10-CM | POA: Insufficient documentation

## 2016-11-09 MED ORDER — IBUPROFEN 400 MG PO TABS: 400.0000 mg | ORAL_TABLET | Freq: Once | ORAL | Status: DC | PRN

## 2016-11-09 MED ORDER — IBUPROFEN 400 MG PO TABS
ORAL_TABLET | ORAL | Status: AC
  Administered 2016-11-09: 400 mg
  Filled 2016-11-09: qty 1
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
## 2016-11-10 ENCOUNTER — Emergency Department (HOSPITAL_COMMUNITY): Payer: Self-pay

## 2016-11-10 ENCOUNTER — Encounter (HOSPITAL_COMMUNITY): Payer: Self-pay | Admitting: Emergency Medicine

## 2016-11-10 ENCOUNTER — Emergency Department (HOSPITAL_COMMUNITY)
Admission: EM | Admit: 2016-11-10 | Discharge: 2016-11-10 | Disposition: A | Discharge status: Home / Self Care | Payer: Self-pay | Attending: Emergency Medicine | Admitting: Emergency Medicine

## 2016-11-10 DIAGNOSIS — F17210 Nicotine dependence, cigarettes, uncomplicated: Secondary | ICD-10-CM | POA: Insufficient documentation

## 2016-11-10 DIAGNOSIS — Y999 Unspecified external cause status: Secondary | ICD-10-CM | POA: Insufficient documentation

## 2016-11-10 DIAGNOSIS — Y929 Unspecified place or not applicable: Secondary | ICD-10-CM | POA: Insufficient documentation

## 2016-11-10 DIAGNOSIS — Y9389 Activity, other specified: Secondary | ICD-10-CM | POA: Insufficient documentation

## 2016-11-10 DIAGNOSIS — S0990XA Unspecified injury of head, initial encounter: Secondary | ICD-10-CM

## 2016-11-10 DIAGNOSIS — S0083XA Contusion of other part of head, initial encounter: Secondary | ICD-10-CM | POA: Insufficient documentation

## 2016-11-10 MED ORDER — TETANUS-DIPHTH-ACELL PERTUSSIS 5-2.5-18.5 LF-MCG/0.5 IM SUSP
0.5000 mL | Freq: Once | INTRAMUSCULAR | Status: AC
  Administered 2016-11-10: 0.5 mL via INTRAMUSCULAR
  Filled 2016-11-10: qty 0.5
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
## 2016-11-10 NOTE — ED Notes (Signed)
Pt states he understands instructions and home stabel with steady gait with friend.
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
## 2016-11-10 NOTE — ED Notes (Signed)
Pt came to NF and advised he was going to walk outside. Advised him to wait because the triage nurse was going to call him to be triaged.

## 2017-11-18 ENCOUNTER — Emergency Department (HOSPITAL_COMMUNITY)
Admission: EM | Admit: 2017-11-18 | Discharge: 2017-11-19 | Disposition: A | Discharge status: Home / Self Care | Payer: Self-pay | Attending: Emergency Medicine | Admitting: Emergency Medicine

## 2017-11-18 ENCOUNTER — Encounter (HOSPITAL_COMMUNITY): Payer: Self-pay | Admitting: *Deleted

## 2017-11-18 ENCOUNTER — Emergency Department (HOSPITAL_COMMUNITY)
Admission: EM | Admit: 2017-11-18 | Discharge: 2017-11-18 | Discharge status: Against Medical Advice | Payer: Self-pay | Attending: Emergency Medicine | Admitting: Emergency Medicine

## 2017-11-18 DIAGNOSIS — L02412 Cutaneous abscess of left axilla: Secondary | ICD-10-CM | POA: Insufficient documentation

## 2017-11-18 DIAGNOSIS — R45851 Suicidal ideations: Secondary | ICD-10-CM | POA: Insufficient documentation

## 2017-11-18 DIAGNOSIS — F1510 Other stimulant abuse, uncomplicated: Secondary | ICD-10-CM

## 2017-11-18 DIAGNOSIS — Z046 Encounter for general psychiatric examination, requested by authority: Secondary | ICD-10-CM | POA: Insufficient documentation

## 2017-11-18 DIAGNOSIS — F919 Conduct disorder, unspecified: Secondary | ICD-10-CM | POA: Insufficient documentation

## 2017-11-18 DIAGNOSIS — F1910 Other psychoactive substance abuse, uncomplicated: Secondary | ICD-10-CM | POA: Insufficient documentation

## 2017-11-18 DIAGNOSIS — F1994 Other psychoactive substance use, unspecified with psychoactive substance-induced mood disorder: Secondary | ICD-10-CM | POA: Insufficient documentation

## 2017-11-18 DIAGNOSIS — F1520 Other stimulant dependence, uncomplicated: Secondary | ICD-10-CM | POA: Insufficient documentation

## 2017-11-18 LAB — COMPREHENSIVE METABOLIC PANEL
ALBUMIN: 4.4 g/dL (ref 3.5–5.0)
ALT: 16 U/L — ABNORMAL LOW (ref 17–63)
ALT: 15 U/L — AB (ref 17–63)
ANION GAP: 16 — AB (ref 5–15)
AST: 27 U/L (ref 15–41)
AST: 27 U/L (ref 15–41)
Albumin: 4.2 g/dL (ref 3.5–5.0)
Alkaline Phosphatase: 62 U/L (ref 38–126)
Alkaline Phosphatase: 62 U/L (ref 38–126)
Anion gap: 16 — ABNORMAL HIGH (ref 5–15)
BUN: 11 mg/dL (ref 6–20)
BUN: 12 mg/dL (ref 6–20)
CHLORIDE: 107 mmol/L (ref 101–111)
CHLORIDE: 106 mmol/L (ref 101–111)
CO2: 21 mmol/L — ABNORMAL LOW (ref 22–32)
CO2: 19 mmol/L — ABNORMAL LOW (ref 22–32)
CREATININE: 1.14 mg/dL (ref 0.61–1.24)
Calcium: 9.8 mg/dL (ref 8.9–10.3)
Calcium: 9.5 mg/dL (ref 8.9–10.3)
Creatinine, Ser: 1.17 mg/dL (ref 0.61–1.24)
GFR calc Af Amer: >60 mL/min (ref >60)
GLUCOSE: 121 mg/dL — AB (ref 65–99)
Glucose, Bld: 124 mg/dL — ABNORMAL HIGH (ref 65–99)
POTASSIUM: 3.6 mmol/L (ref 3.5–5.1)
Potassium: 3.6 mmol/L (ref 3.5–5.1)
Sodium: 144 mmol/L (ref 135–145)
Sodium: 141 mmol/L (ref 135–145)
Total Bilirubin: 0.4 mg/dL (ref 0.3–1.2)
Total Bilirubin: 0.4 mg/dL (ref 0.3–1.2)
Total Protein: 7.9 g/dL (ref 6.5–8.1)
Total Protein: 8 g/dL (ref 6.5–8.1)

## 2017-11-18 LAB — CBC WITH DIFFERENTIAL/PLATELET
BASOS ABS: 0 10*3/uL (ref 0.0–0.1)
BASOS PCT: 0 %
Eosinophils Absolute: 0.3 10*3/uL (ref 0.0–0.7)
Eosinophils Relative: 2 %
HEMATOCRIT: 44.3 % (ref 39.0–52.0)
Hemoglobin: 15 g/dL (ref 13.0–17.0)
LYMPHS PCT: 27 %
Lymphs Abs: 3.2 10*3/uL (ref 0.7–4.0)
MCH: 27.5 pg (ref 26.0–34.0)
MCHC: 33.9 g/dL (ref 30.0–36.0)
MCV: 81.1 fL (ref 78.0–100.0)
Monocytes Absolute: 1 10*3/uL (ref 0.1–1.0)
Monocytes Relative: 9 %
NEUTROS ABS: 7.4 10*3/uL (ref 1.7–7.7)
NEUTROS PCT: 62 %
Platelets: 309 10*3/uL (ref 150–400)
RBC: 5.46 MIL/uL (ref 4.22–5.81)
RDW: 12.3 % (ref 11.5–15.5)
WBC: 11.9 10*3/uL — AB (ref 4.0–10.5)

## 2017-11-18 LAB — CBC
HEMATOCRIT: 44 % (ref 39.0–52.0)
Hemoglobin: 15 g/dL (ref 13.0–17.0)
MCH: 27.6 pg (ref 26.0–34.0)
MCHC: 34.1 g/dL (ref 30.0–36.0)
MCV: 81 fL (ref 78.0–100.0)
PLATELETS: 333 10*3/uL (ref 150–400)
RBC: 5.43 MIL/uL (ref 4.22–5.81)
RDW: 12.4 % (ref 11.5–15.5)
WBC: 12 10*3/uL — AB (ref 4.0–10.5)

## 2017-11-18 LAB — ETHANOL

## 2017-11-18 LAB — ACETAMINOPHEN LEVEL: Acetaminophen (Tylenol), Serum: <10 ug/mL — ABNORMAL LOW (ref 10–30)

## 2017-11-18 LAB — SALICYLATE LEVEL: Salicylate Lvl: <7 mg/dL (ref 2.8–30.0)

## 2017-11-18 MED ORDER — LORAZEPAM 2 MG/ML IJ SOLN
2.0000 mg | Freq: Four times a day (QID) | INTRAMUSCULAR | Status: DC | PRN
  Administered 2017-11-18: 2 mg via INTRAMUSCULAR
  Filled 2017-11-18: qty 1

## 2017-11-18 MED ORDER — DOXYCYCLINE HYCLATE 100 MG PO TABS
100.0000 mg | ORAL_TABLET | Freq: Two times a day (BID) | ORAL | Status: DC
  Administered 2017-11-19: 100 mg via ORAL
  Filled 2017-11-18 (×2): qty 1

## 2017-11-18 MED ORDER — ZIPRASIDONE MESYLATE 20 MG IM SOLR: 20.0000 mg | Freq: Once | INTRAMUSCULAR | Status: DC

## 2017-11-18 MED ORDER — LORAZEPAM 1 MG PO TABS: 2.0000 mg | ORAL_TABLET | Freq: Once | ORAL | Status: DC

## 2017-11-18 MED ORDER — ZIPRASIDONE MESYLATE 20 MG IM SOLR
20.0000 mg | Freq: Once | INTRAMUSCULAR | Status: AC | PRN
  Administered 2017-11-18: 20 mg via INTRAMUSCULAR
  Filled 2017-11-18: qty 20

## 2017-11-18 MED ORDER — DIPHENHYDRAMINE HCL 50 MG/ML IJ SOLN
50.0000 mg | Freq: Three times a day (TID) | INTRAMUSCULAR | Status: DC | PRN
  Administered 2017-11-18: 50 mg via INTRAMUSCULAR
  Filled 2017-11-18: qty 1

## 2017-11-18 MED ORDER — IBUPROFEN 200 MG PO TABS: 600.0000 mg | ORAL_TABLET | Freq: Three times a day (TID) | ORAL | Status: DC | PRN

## 2017-11-18 NOTE — Progress Notes (Signed)
Per Nira Conn, NP pt is recommended for continued observation due to safety and stabilization. EDP Benjiman Core, MD and pt's nurse Winfield Cunas, RN have been advised for disposition.   Princess Bruins, MSW, LCSW Therapeutic Triage Specialist  [phone]
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
## 2017-11-18 NOTE — ED Notes (Signed)
Pt Belongings: [employer], Pants, Black belt, shirt. Belongings secured in locker #41. Belongings also secured with security.
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
## 2017-11-18 NOTE — ED Notes (Signed)
Pt banging phone receiver on wall.
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
## 2017-11-18 NOTE — BH Assessment (Addendum)
Pt no longer in hall bed. Will assess when/if new location posted
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
## 2017-11-18 NOTE — ED Notes (Signed)
Pt noted to no longer be in [facility] bed.  Security and [facility] notified.
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
## 2017-11-18 NOTE — ED Notes (Signed)
Doctor Rubin Payor at bedside assessing patient.

## 2017-11-18 NOTE — ED Notes (Signed)
Family and girlfriend who IVC'd patient no longer in waiting room. AC [place] and AD Toniann Fail attempted to contact and update.

## 2017-11-18 NOTE — ED Notes (Signed)
Pt belongings given to [employer] and Security.

## 2017-11-18 NOTE — ED Notes (Signed)
Patient became angry after nurse told him to get off the phone.  Patient had been on the phone for a while and was beginning to get agitated while talking.  Patient went to room and pulled off his door and started hitting his arm against the wall.  Patient saying to tech, "I'm not going to stay in this unit all night."

## 2017-11-18 NOTE — ED Notes (Signed)
Nurse practitioner notified for prn meds.

## 2017-11-18 NOTE — ED Notes (Signed)
Patient was last seen before this RN had to run a code blue for another patient. I was told halfway through the code that this patient was no longer here.
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
## 2017-11-18 NOTE — ED Notes (Signed)
This

## 2017-11-18 NOTE — ED Notes (Signed)
Patient currently sleeping in the quiet room with the door open. Pt with a pillow under his head and a blanket on for comfort. Pt with regular, even and unlabored respirations at this time. No noted distress currently.

## 2017-11-19 DIAGNOSIS — F1514 Other stimulant abuse with stimulant-induced mood disorder: Secondary | ICD-10-CM

## 2017-11-19 DIAGNOSIS — R451 Restlessness and agitation: Secondary | ICD-10-CM

## 2017-11-19 DIAGNOSIS — F419 Anxiety disorder, unspecified: Secondary | ICD-10-CM

## 2017-11-19 DIAGNOSIS — R450 Nervousness: Secondary | ICD-10-CM

## 2017-11-19 DIAGNOSIS — F1994 Other psychoactive substance use, unspecified with psychoactive substance-induced mood disorder: Secondary | ICD-10-CM | POA: Diagnosis present

## 2017-11-19 DIAGNOSIS — F1510 Other stimulant abuse, uncomplicated: Secondary | ICD-10-CM | POA: Diagnosis present

## 2017-11-19 MED ORDER — DOXYCYCLINE HYCLATE 100 MG PO TABS: 100.0000 mg | ORAL_TABLET | Freq: Two times a day (BID) | ORAL | 0 refills | Status: DC

## 2017-11-19 NOTE — [facility] Suicide Risk Assessment (Signed)
Suicide Risk Assessment  Discharge Assessment   [facility] Discharge Suicide Risk Assessment   Principal Problem: Substance induced mood disorder [facility]) Discharge Diagnoses:  Patient Active Problem List   Diagnosis Date Noted  . Methamphetamine abuse (HCC) [F15.10] 11/19/2017    Priority: High  . Substance induced mood disorder [facility]) [F19.94] 11/19/2017    Priority: High    Total Time spent with patient: 45 minutes  Musculoskeletal: Strength & Muscle Tone: within normal limits Gait & Station: normal Patient leans: N/A  Psychiatric Specialty Exam: Physical Exam  Constitutional: He is oriented to person, place, and time. He appears well-developed and well-nourished.  HENT:  Head: Normocephalic.  Neck: Normal range of motion.  Respiratory: Effort normal.  Musculoskeletal: Normal range of motion.  Neurological: He is alert and oriented to person, place, and time.  Psychiatric: His speech is normal and behavior is normal. Judgment and thought content normal. His mood appears anxious. Cognition and memory are normal.    Review of Systems  Psychiatric/Behavioral: Positive for substance abuse. The patient is nervous/anxious.   All other systems reviewed and are negative.   Blood pressure 138/73, pulse (!) 57, temperature 98.2 F (36.8 C), temperature source Oral, resp. rate 18, SpO2 98 %.There is no height or weight on file to calculate BMI.  General Appearance: Casual  Eye Contact:  Good  Speech:  Normal Rate  Volume:  Normal  Mood:  Euthymic  Affect:  Congruent  Thought Process:  Coherent and Descriptions of Associations: Intact  Orientation:  Full (Time, Place, and Person)  Thought Content:  WDL and Logical  Suicidal Thoughts:  No  Homicidal Thoughts:  No  Memory:  Immediate;   Good Recent;   Good Remote;   Good  Judgement:  Fair  Insight:  Fair  Psychomotor Activity:  Normal  Concentration:  Concentration: Good and Attention Span: Good  Recall:  Good  Fund of Knowledge:   Fair  Language:  Good  Akathisia:  No  Handed:  Right  AIMS (if indicated):     Assets:  Housing Leisure Time Physical Health Resilience Social Support  ADL's:  Intact  Cognition:  WNL  Sleep:       Mental Status Per Nursing Assessment::   On Admission:   meth abuse with aggression  Demographic Factors:  Male and Adolescent or young adult  Loss Factors: NA  Historical Factors: NA  Risk Reduction Factors:   Sense of responsibility to family, Living with another person, especially a relative and Positive social support  Continued Clinical Symptoms:  None   Cognitive Features That Contribute To Risk:  None    Suicide Risk:  Minimal: No identifiable suicidal ideation.  Patients presenting with no risk factors but with morbid ruminations; may be classified as minimal risk based on the severity of the depressive symptoms    Plan Of Care/Follow-up recommendations:  Activity:  as tolerated Diet:  heart healthy diet  Afiya Ferrebee, NP 11/19/2017, 5:06 PM
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
## 2017-11-19 NOTE — ED Notes (Signed)
Patient remains asleep after having been sedated on night shift.  Patient had to be placed in the seclusion room after medication given IM last night due to violent behavior (see night shift note), then later after patient went to sleep door to seclusion room was left opened and patient walked to his own bed and fell asleep.  He has been asleep since.  Breakfast tray offered to patient.

## 2017-12-10 ENCOUNTER — Encounter (HOSPITAL_COMMUNITY): Payer: Self-pay | Admitting: Emergency Medicine

## 2018-01-18 ENCOUNTER — Encounter (HOSPITAL_COMMUNITY): Payer: Self-pay

## 2018-01-18 ENCOUNTER — Emergency Department (HOSPITAL_COMMUNITY): Payer: Self-pay

## 2018-01-18 ENCOUNTER — Emergency Department (HOSPITAL_COMMUNITY)
Admission: EM | Admit: 2018-01-18 | Discharge: 2018-01-18 | Disposition: A | Discharge status: Home / Self Care | Payer: Self-pay | Attending: Emergency Medicine | Admitting: Emergency Medicine

## 2018-01-18 DIAGNOSIS — F17210 Nicotine dependence, cigarettes, uncomplicated: Secondary | ICD-10-CM | POA: Insufficient documentation

## 2018-01-18 DIAGNOSIS — R51 Headache: Secondary | ICD-10-CM | POA: Insufficient documentation

## 2018-01-18 DIAGNOSIS — R519 Headache, unspecified: Secondary | ICD-10-CM

## 2018-01-18 DIAGNOSIS — L02419 Cutaneous abscess of limb, unspecified: Secondary | ICD-10-CM

## 2018-01-18 DIAGNOSIS — L02414 Cutaneous abscess of left upper limb: Secondary | ICD-10-CM | POA: Insufficient documentation

## 2018-01-18 DIAGNOSIS — H1033 Unspecified acute conjunctivitis, bilateral: Secondary | ICD-10-CM

## 2018-01-18 LAB — COMPREHENSIVE METABOLIC PANEL
ALBUMIN: 3.7 g/dL (ref 3.5–5.0)
ALK PHOS: 63 U/L (ref 38–126)
ALT: 15 U/L (ref 0–44)
AST: 17 U/L (ref 15–41)
Anion gap: 6 (ref 5–15)
BUN: 8 mg/dL (ref 6–20)
CALCIUM: 9.8 mg/dL (ref 8.9–10.3)
CO2: 31 mmol/L (ref 22–32)
CREATININE: 0.85 mg/dL (ref 0.61–1.24)
Chloride: 101 mmol/L (ref 98–111)
GFR calc Af Amer: >60 mL/min (ref >60)
GFR calc non Af Amer: >60 mL/min (ref >60)
GLUCOSE: 93 mg/dL (ref 70–99)
Potassium: 4.9 mmol/L (ref 3.5–5.1)
SODIUM: 138 mmol/L (ref 135–145)
TOTAL PROTEIN: 7.1 g/dL (ref 6.5–8.1)
Total Bilirubin: 0.3 mg/dL (ref 0.3–1.2)

## 2018-01-18 LAB — CBC WITH DIFFERENTIAL/PLATELET
Abs Immature Granulocytes: 0.1 10*3/uL (ref 0.0–0.1)
BASOS PCT: 1 %
Basophils Absolute: 0.1 10*3/uL (ref 0.0–0.1)
EOS ABS: 0.2 10*3/uL (ref 0.0–0.7)
EOS PCT: 2 %
HCT: 50.9 % (ref 39.0–52.0)
HEMOGLOBIN: 16.1 g/dL (ref 13.0–17.0)
Immature Granulocytes: 1 %
LYMPHS PCT: 23 %
Lymphs Abs: 2.1 10*3/uL (ref 0.7–4.0)
MCH: 26.4 pg (ref 26.0–34.0)
MCHC: 31.6 g/dL (ref 30.0–36.0)
MCV: 83.6 fL (ref 78.0–100.0)
MONO ABS: 0.8 10*3/uL (ref 0.1–1.0)
Monocytes Relative: 9 %
Neutro Abs: 6.2 10*3/uL (ref 1.7–7.7)
Neutrophils Relative %: 66 %
Platelets: 316 10*3/uL (ref 150–400)
RBC: 6.09 MIL/uL — ABNORMAL HIGH (ref 4.22–5.81)
RDW: 12.5 % (ref 11.5–15.5)
WBC: 9.3 10*3/uL (ref 4.0–10.5)

## 2018-01-18 MED ORDER — LIDOCAINE-EPINEPHRINE (PF) 2 %-1:200000 IJ SOLN
20.0000 mL | Freq: Once | INTRAMUSCULAR | Status: AC
  Administered 2018-01-18: 20 mL
  Filled 2018-01-18: qty 20

## 2018-01-18 MED ORDER — SULFAMETHOXAZOLE-TRIMETHOPRIM 800-160 MG PO TABS: 2.0000 | ORAL_TABLET | Freq: Two times a day (BID) | ORAL | 0 refills | Status: AC

## 2018-01-18 MED ORDER — SODIUM CHLORIDE 0.9 % IV BOLUS
1000.0000 mL | Freq: Once | INTRAVENOUS | Status: AC
  Administered 2018-01-18: 1000 mL via INTRAVENOUS

## 2018-01-18 MED ORDER — IOHEXOL 300 MG/ML  SOLN
75.0000 mL | Freq: Once | INTRAMUSCULAR | Status: AC | PRN
  Administered 2018-01-18: 75 mL via INTRAVENOUS

## 2018-01-18 MED ORDER — OFLOXACIN 0.3 % OP SOLN
1.0000 [drp] | Freq: Four times a day (QID) | OPHTHALMIC | Status: DC
  Administered 2018-01-18 (×2): 1 [drp] via OPHTHALMIC
  Filled 2018-01-18: qty 5

## 2018-01-18 MED ORDER — METOCLOPRAMIDE HCL 5 MG/ML IJ SOLN
10.0000 mg | Freq: Once | INTRAMUSCULAR | Status: AC
  Administered 2018-01-18: 10 mg via INTRAVENOUS
  Filled 2018-01-18: qty 2

## 2018-01-18 MED ORDER — SULFAMETHOXAZOLE-TRIMETHOPRIM 800-160 MG PO TABS
2.0000 | ORAL_TABLET | Freq: Once | ORAL | Status: AC
  Administered 2018-01-18: 2 via ORAL
  Filled 2018-01-18: qty 2

## 2018-01-18 MED ORDER — DIPHENHYDRAMINE HCL 50 MG/ML IJ SOLN
25.0000 mg | Freq: Once | INTRAMUSCULAR | Status: AC
  Administered 2018-01-18: 25 mg via INTRAVENOUS
  Filled 2018-01-18: qty 1

## 2018-01-18 MED ORDER — DEXAMETHASONE SODIUM PHOSPHATE 10 MG/ML IJ SOLN
10.0000 mg | Freq: Once | INTRAMUSCULAR | Status: AC
  Administered 2018-01-18: 10 mg via INTRAVENOUS
  Filled 2018-01-18: qty 1

## 2018-01-18 MED ORDER — ACETAMINOPHEN 500 MG PO TABS
1000.0000 mg | ORAL_TABLET | Freq: Once | ORAL | Status: AC
  Administered 2018-01-18: 1000 mg via ORAL
  Filled 2018-01-18: qty 2

## 2018-01-18 NOTE — ED Notes (Signed)
Pt called out stating for this tech to take his IV out immediatly so he could go home. When asked if he was up for discharge, Pt stated "yes". Pt was informed he was not actually up for discharge and that he was advised to stay for his antibiotics. Pt calmed down and is compliant at this time.

## 2018-01-18 NOTE — ED Notes (Signed)
Pt diaphoretic and pale at registration desk, pt has mod amt of pus coming from an abcess on the L arm

## 2018-01-18 NOTE — ED Notes (Signed)
Pt alert and oriented in NAD. Pt verbalized understanding of discharge instructions. 

## 2018-01-18 NOTE — Discharge Instructions (Addendum)
Please see the information and instructions below regarding your visit.  Your diagnoses today include:  1. Arm abscess   2. Bad headache   3. Acute conjunctivitis of both eyes, unspecified acute conjunctivitis type     Abscesses form when an infection in your skin starts to collect bacteria and white blood cells, walling it off from the rest of your body to protect you from a bigger infection. Risk factors for this type of infection include:  ?Break in the skin ?Diabetes ?Swollen areas  Sometimes the infection starts to spread to surrounding tissue, causing redness and swelling. We call this cellulitis.   Tests performed today include: See side panel of your discharge paperwork for testing performed today. Vital signs are listed at the bottom of these instructions.   Your CT scan demonstrated no signs of blood clots within the vasculature of the brain.  Your lab work was normal today.  Medications prescribed:    Take any prescribed medications only as prescribed, and any over the counter medications only as directed on the packaging.  1. Please take all of your antibiotics until finished.   You may develop abdominal discomfort or nausea from the antibiotic. If this occurs, you may take it with food. Some patients also get diarrhea with antibiotics. You may help offset this with probiotics which you can buy or get in yogurt. Do not eat or take the probiotics until 2 hours after your antibiotic. Some women develop vaginal yeast infections after antibiotics. If you develop unusual vaginal discharge after being on this medication, please see your primary care provider.   Some people develop allergies to antibiotics. Symptoms of antibiotic allergy can be mild and include a flat rash and itching. They can also be more serious and include:  ?Hives - Hives are raised, red patches of skin that are usually very itchy.  ?Lip or tongue swelling  ?Trouble swallowing or breathing  ?Blistering  of the skin or mouth.  If you have any of these serious symptoms, please seek emergency medical care immediately.  2. You are prescribed ibuprofen, a non-steroidal anti-inflammatory agent (NSAID) for pain. You may take 600 mg every 6 hours as needed for pain. If still requiring this medication around the clock for acute pain after 10 days, please see your primary healthcare provider.  Women who are pregnant, breastfeeding, or planning on becoming pregnant should not take non-steroidal anti-inflammatories such as Advil and Aleve. Tylenol is a safe over the counter pain reliever in pregnant women.  You may combine this medication with Tylenol, 650 mg every 6 hours, so you are receiving something for pain every 3 hours.  This is not a long-term medication unless under the care and direction of your primary provider. Taking this medication long-term and not under the supervision of a healthcare provider could increase the risk of stomach ulcers, kidney problems, and cardiovascular problems such as high blood pressure.    3.  You are also prescribed antibiotic drops for your eyes.  Please 1 to 2 drops in each eye every 4 hours for 5 to 7 days.   Home care instructions:  Please follow any educational materials contained in this packet.   Some things that may promote healing of your wound and infection include:  Raise your arm or leg to reduce swelling - Raise the arm or leg up above the level of your heart 3 or 4 times a day, for 30 minutes each time. Keep the infected area clean and dry. You  can take a shower or bath, but be sure to pat the area dry with a towel afterward. Do not put any antibiotic ointments or creams on the area. Reapply a dry gauze dressing any time the bandage has become soaked with drainage, or after cleansing the wound.  Apply warm compresses to the wound 3-4 times daily to encourage drainage.   For conjunctivitis or pinkeye, please discard and/or wash all of your linens.   Please throw out any contacts.  Please wash any glasses.   Return instructions:  Please return to the Emergency Department if you experience worsening symptoms. You should return for reevaluation of your infection if you notice spreading redness, increased swelling, an abscess develops, or you develop signs and symptoms of a systemic illness such as fever and chills.  Please return if you have any other emergent concerns.  Additional Information:   Your vital signs today were: BP 130/80    Pulse 81    Temp 98.6 F (37 C) (Oral)    Resp 16    Ht 6' (1.829 m)    Wt 83.9 kg (185 lb)    SpO2 99%    BMI 25.09 kg/m  If your blood pressure (BP) was elevated on multiple readings during this visit above 130 for the top number or above 80 for the bottom number, please have this repeated by your primary care provider within one month. --------------  Thank you for allowing us to participate in your care today.

## 2018-01-18 NOTE — ED Notes (Signed)
Patient transported to CT 

## 2018-01-18 NOTE — ED Notes (Signed)
Pt refused rectal temp, oral 98.6

## 2018-01-18 NOTE — ED Notes (Signed)
Medication request messaged to pharmacy for eye drops

## 2018-01-18 NOTE — ED Triage Notes (Signed)
Pt alert and oriented. Pt reports abcess for two weeks on left arm and headache for four days

## 2018-01-18 NOTE — ED Provider Notes (Signed)
[facility] EMERGENCY DEPARTMENT Provider Note   CSN: [id number] Arrival date & time: 01/18/18  1234     History   Chief Complaint Chief Complaint  Patient presents with  . Abscess  . Headache    HPI Francis Walters is a 27 y.o. male.  HPI   Patient is a 27 year old male with a history of methamphetamine use, injected, bronchitis, and allergic rhinitis presenting for left elbow abscess and right-sided headache.  Patient reports that he has had abscess for approximately 1 week, and is actually improving after his sister gave him "a medicine" yesterday.  Patient reports that is spontaneously draining on its own.  Patient denies any erythema spreading up his arm.  Patient denies any fevers or chills.  Patient reports he has a history of headaches, but this is the worst headache of his life.  Patient reports photosensitivity, and feeling disoriented on his feet due to the pain.  Patient describes the pain as throbbing and constant.  Patient denies facial pain or purulent nasal drainage.  Patient denies dental pain.  Patient reports that he has tried Tylenol, but it did not improve the headache.  No history of thrombosis.  No family history of subarachnoid hemorrhage or aneurysms.  No history of blood thinning medications.  Patient also noting bilateral eye redness.  Patient reports that also he had an exposure to some of similar symptoms.  Patient reports he has clear drainage.  Past Medical History:  Diagnosis Date  . Bronchitis   . Environmental allergies     Patient Active Problem List   Diagnosis Date Noted  . 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 Substance induced mood disorder (HCC) 11/19/2017  . Hepatitis C antibody positive in blood   . Elevated bilirubin   . RUQ pain   . Abdominal pain 09/27/2016    Past Surgical History:  Procedure Laterality Date  . APPENDECTOMY  04/27/12  . APPENDECTOMY    . LAPAROSCOPIC APPENDECTOMY  04/26/2012   Procedure: APPENDECTOMY LAPAROSCOPIC;  Surgeon: Liz Malady, MD;  Location: [facility] OR;  Service: General;  Laterality: N/A;        Home Medications    Prior to Admission medications   Medication Sig Start Date End Date Taking? Authorizing Provider  benzonatate (TESSALON) 100 MG capsule Take 1 capsule (100 mg total) by mouth 3 (three) times daily as needed for cough. Patient not taking: Reported on 09/27/2016 11/07/12   Gerhard Munch, MD  doxycycline (VIBRA-TABS) 100 MG tablet Take 1 tablet (100 mg total) by mouth every 12 (twelve) hours. 11/19/17   Charm Rings, NP  ibuprofen (ADVIL,MOTRIN) 200 MG tablet Take 600 mg by mouth every 6 (six) hours as needed for headache (pain).    [provider]  ibuprofen (ADVIL,MOTRIN) 600 MG tablet Take 1 tablet (600 mg total) by mouth every 6 (six) hours as needed. Patient not taking: Reported on 09/27/2016 10/26/14   Jaynie Crumble, PA-C    Family History History reviewed. No pertinent family history.  Social History Social History   Tobacco Use  . Smoking status: Current Every Day Smoker    Packs/day: 0.50    Types: Cigarettes  . Smokeless tobacco: Never Used  Substance Use Topics  . Alcohol use: Yes    Comment: social  . Drug use: No     Allergies   Penicillins   Review of Systems Review of Systems  Constitutional: Negative for chills and fever.  HENT: Positive for congestion and rhinorrhea.  Eyes: Positive for redness.  Respiratory: Negative for shortness of breath.   Cardiovascular: Negative for chest pain.  Musculoskeletal: Negative for gait problem.  Skin: Positive for wound. Negative for rash.  Neurological: Positive for headaches. Negative for syncope, weakness and numbness.  All other systems reviewed and are negative.    Physical Exam Updated Vital Signs BP (!) 138/98   Pulse 82   Temp 97.6 F (36.4 C) (Oral)   Resp 16   Ht 6' (1.829 m)   Wt 83.9 kg (185 lb)   SpO2 100%   BMI 25.09 kg/m    Physical Exam  Constitutional: He appears well-developed and well-nourished. No distress.  HENT:  Head: Normocephalic and atraumatic.  Mouth/Throat: Oropharynx is clear and moist.  Erythema bilateral nasal mucosa, with left nare with ulceration of the nasal septum without perforation. No facial pain to palpation.  Eyes: Pupils are equal, round, and reactive to light. EOM are normal.  Diffuse conjunctival injection with watery drainage bilaterally.  Neck: Normal range of motion. Neck supple.  No nuchal rigidity.  No meningismus.  Negative Brudzinski sign.  Cardiovascular: Normal rate, regular rhythm, S1 normal and S2 normal.  No murmur heard. Pulmonary/Chest: Effort normal and breath sounds normal. He has no wheezes. He has no rales.  Abdominal: Soft. He exhibits no distension. There is no tenderness. There is no guarding.  Musculoskeletal: Normal range of motion. He exhibits no edema or deformity.  Lymphadenopathy:    He has no cervical adenopathy.  Neurological: He is alert. GCS eye subscore is 4. GCS verbal subscore is 5. GCS motor subscore is 6.  Mental Status:  Alert, oriented, thought content appropriate, able to give a coherent history. Speech fluent without evidence of aphasia. Able to follow 2 step commands without difficulty.  Cranial Nerves:  II:  Peripheral visual fields grossly normal, pupils equal, round, reactive to light III,IV, VI: ptosis not present, extra-ocular motions intact bilaterally  V,VII: smile symmetric, facial light touch sensation equal VIII: hearing grossly normal to voice  X: uvula elevates symmetrically  XI: bilateral shoulder shrug symmetric and strong XII: midline tongue extension without fassiculations Motor:  Normal tone. 5/5 in upper and lower extremities bilaterally including strong and equal grip strength and dorsiflexion/plantar flexion Sensory: Pinprick and light touch normal in all extremities.  Deep Tendon Reflexes: 2+ and symmetric in  the biceps and patella. Cerebellar: normal finger-to-nose with bilateral upper extremities Gait: normal gait and balance Stance: No pronator drift and good coordination, strength, and position sense with tapping of bilateral arms (performed in sitting position). CV: distal pulses palpable throughout  Normal gait and balance.  Skin: Skin is warm and dry. No rash noted. There is erythema.  Patient has indurated and scabbed over lesion just distal to the left elbow.  It has active purulent drainage.  Minimal surrounding erythema.  Psychiatric: He has a normal mood and affect. His behavior is normal. Judgment and thought content normal.  Nursing note and vitals reviewed.    ED Treatments / Results  Labs (all labs ordered are listed, but only abnormal results are displayed) Labs Reviewed  CBC WITH DIFFERENTIAL/PLATELET - Abnormal; Notable for the following components:      Result Value   RBC 6.09 (*)    All other components within normal limits  COMPREHENSIVE METABOLIC PANEL    EKG None  Radiology Ct Venogram Head  Result Date: 01/18/2018 CLINICAL DATA:  Headache for 4 days.  Arm abscess. EXAM: CT HEAD WITHOUT CONTRAST CT VENOGRAM HEAD  TECHNIQUE: Contiguous axial images were obtained from the base of the skull through the vertex with and without intravenous contrast. CONTRAST:  75mL OMNIPAQUE IOHEXOL 300 MG/ML  SOLN COMPARISON:  CT HEAD Nov 10, 2016 FINDINGS: CT HEAD: BRAIN: No intraparenchymal hemorrhage, mass effect nor midline shift. The ventricles and sulci are normal. No acute large vascular territory infarcts. No abnormal extra-axial fluid collections. Basal cisterns are patent. VASCULAR: Unremarkable. SKULL/SOFT TISSUES: No skull fracture. No significant soft tissue swelling. ORBITS/SINUSES: The included ocular globes and orbital contents are non suspicious; dysconjugate gaze is likely transient. Mild paranasal sinus mucosal thickening without air-fluid levels. Mastoid air cells are  well aerated. OTHER: None. CTV HEAD: Normal contrast enhancement within the superior sagittal sinus, torcula of the Herophili, bilateral transverse, sigmoid sinuses and included internal jugular veins. Normal contrast enhancement of the internal cerebral veins. No abnormal intracranial enhancement. IMPRESSION: 1. Normal noncontrast CT HEAD. 2. Normal CT venogram head. Electronically Signed   By: Awilda Metro M.D.   On: 01/18/2018 19:37    Procedures .Marland KitchenIncision and Drainage Date/Time: 01/18/2018 10:46 PM Performed by: Elisha Ponder, PA-C Authorized by: Elisha Ponder, PA-C   Consent:    Consent obtained:  Verbal   Consent given by:  Patient   Risks discussed:  Bleeding, incomplete drainage and pain   Alternatives discussed:  No treatment Location:    Type:  Abscess   Location:  Upper extremity   Upper extremity location:  Elbow   Elbow location:  L elbow Pre-procedure details:    Skin preparation:  Betadine Anesthesia (see MAR for exact dosages):    Anesthesia method:  Local infiltration   Local anesthetic:  Lidocaine 2% WITH epi Procedure type:    Complexity:  Simple Procedure details:    Incision types:  Stab incision   Incision depth:  Dermal   Scalpel blade:  11   Wound management:  Probed and deloculated   Drainage:  Purulent and bloody   Drainage amount:  Scant   Wound treatment:  Wound left open   Packing materials:  None Post-procedure details:    Patient tolerance of procedure:  Tolerated well, no immediate complications   (including critical care time)  Medications Ordered in ED Medications  sodium chloride 0.9 % bolus 1,000 mL (has no administration in time range)  metoCLOPramide (REGLAN) injection 10 mg (has no administration in time range)  diphenhydrAMINE (BENADRYL) injection 25 mg (has no administration in time range)     Initial Impression / Assessment and Plan / ED Course  I have reviewed the triage vital signs and the nursing  notes.  Pertinent labs & imaging results that were available during my care of the patient were reviewed by me and considered in my medical decision making (see chart for details).  Clinical Course as of Jan 19 2239  Tue Jan 18, 2018  1457 Tdap given in 2018.   [AM]  1952 Patient states that headache is absent at present.  Ambulated in the emergency department without difficulty.   [AM]    Clinical Course User Index [AM] Elisha Ponder, PA-C    Patient nontoxic-appearing, afebrile, but reporting significant discomfort due to the headache, and lack of sleep.  Differential diagnosis for headache includes cerebral venous thrombosis, subarachnoid hemorrhage, septic emboli secondary to IVDU, meningitis.  Do not suspect meningitis, as patient does not have a fever, is neurologically intact, and has no nuchal rigidity.  Will evaluate for cerebral venous thrombosis given IVDU history with CTV, as well  as including CT of sinuses given patient's right-sided pain that appears to be localized to the sinus area.  Abscess is not overlying joint, and there is no overlying erythema or pain with passive movement of the left elbow to suggest septic arthritis.  Patient has no leukocytosis or other acute abnormality on lab work.  Transaminitis has resolved.  Patient has normal CT venogram, and CT of the sinuses demonstrates mucosal thickening without air-fluid levels.  This is not clinically correlate to acute sinusitis, and feels most likely secondary to patient's intranasal methamphetamine use.    Patient had complete resolution of his headache during emergency department course with supportive care, and was neurologically intact and ambulating, and tolerating p.o. at the time of discharge.  Patient reports that he is in the process of detoxing from methamphetamines at home, and is not interested in receiving resources for substance use.  Will treat patient with Bactrim for abscess.  Clinically, patient appears  to have viral versus bacterial conjunctivitis.  Will treat with ofloxacin drops.  Eye symptoms appear to improve during emergency department course.  Patient given wound care instructions for abscesses including warm compresses, cleansing, and dressing changes.  Patient is instructed return to the emergency department for any increasing erythema, redevelopment of abscess, or fevers.  Patient and family are in understanding and agree with the plan of care.  This is a supervised visit with Dr. Marily Memos. Evaluation, management, and discharge planning discussed with this attending physician.  Final Clinical Impressions(s) / ED Diagnoses   Final diagnoses:  Bad headache  Arm abscess  Acute conjunctivitis of both eyes, unspecified acute conjunctivitis type    ED Discharge Orders        Ordered    sulfamethoxazole-trimethoprim (BACTRIM DS,SEPTRA DS) 800-160 MG tablet  2 times daily     01/18/18 2031       Delia Chimes 01/18/18 2247    Mesner, Barbara Cower, MD 01/18/18 2304

## 2019-03-09 ENCOUNTER — Other Ambulatory Visit: Payer: Self-pay

## 2019-03-09 ENCOUNTER — Encounter (HOSPITAL_COMMUNITY): Payer: Self-pay

## 2019-03-09 ENCOUNTER — Emergency Department (HOSPITAL_COMMUNITY)
Admission: EM | Admit: 2019-03-09 | Discharge: 2019-03-09 | Disposition: A | Discharge status: Home / Self Care | Payer: Self-pay | Attending: Emergency Medicine | Admitting: Emergency Medicine

## 2019-03-09 DIAGNOSIS — Z87891 Personal history of nicotine dependence: Secondary | ICD-10-CM | POA: Insufficient documentation

## 2019-03-09 DIAGNOSIS — L03113 Cellulitis of right upper limb: Secondary | ICD-10-CM | POA: Insufficient documentation

## 2019-03-09 DIAGNOSIS — L02413 Cutaneous abscess of right upper limb: Secondary | ICD-10-CM | POA: Insufficient documentation

## 2019-03-09 MED ORDER — DOXYCYCLINE HYCLATE 100 MG PO TABS
100.0000 mg | ORAL_TABLET | Freq: Once | ORAL | Status: AC
  Administered 2019-03-09: 100 mg via ORAL
  Filled 2019-03-09: qty 1

## 2019-03-09 MED ORDER — DOXYCYCLINE HYCLATE 100 MG PO CAPS: 100.0000 mg | ORAL_CAPSULE | Freq: Two times a day (BID) | ORAL | 0 refills | Status: DC

## 2019-03-09 MED ORDER — HYDROCODONE-ACETAMINOPHEN 5-325 MG PO TABS
1.0000 | ORAL_TABLET | Freq: Once | ORAL | Status: AC
  Administered 2019-03-09: 1 via ORAL
  Filled 2019-03-09: qty 1

## 2019-03-09 MED ORDER — LIDOCAINE-EPINEPHRINE (PF) 2 %-1:200000 IJ SOLN
10.0000 mL | Freq: Once | INTRAMUSCULAR | Status: AC
  Administered 2019-03-09: 10 mL
  Filled 2019-03-09: qty 10

## 2019-03-09 NOTE — ED Triage Notes (Signed)
Patient c/o right arm swelling, redness and pain since yesterday.

## 2019-03-09 NOTE — ED Provider Notes (Signed)
[facility] DEPT Provider Note   CSN: [id number] Arrival date & time: 03/09/19  1646     History   Chief Complaint Chief Complaint  Patient presents with  . Arm Pain  . Arm Swelling    HPI Francis Walters is a 28 y.o. male.     The history is provided by the patient.  Arm Pain This is a new problem. The current episode started yesterday. The problem occurs constantly. The problem has been rapidly worsening. Associated symptoms comments: Swelling, redness, drainage, chills.  No nausea, vomiting, abdominal pain.  Patient states about a year ago he did try using IV drugs but has not used any since.. The symptoms are aggravated by bending (Palpation). Nothing relieves the symptoms. He has tried a warm compress for the symptoms. The treatment provided no relief.    Past Medical History:  Diagnosis Date  . Bronchitis   . Environmental allergies     Patient Active Problem List   Diagnosis Date Noted  . Methamphetamine abuse ([place]) 11/19/2017  . Substance induced mood disorder ([place]) 11/19/2017  . Hepatitis C antibody positive in blood   . Elevated bilirubin   . RUQ pain   . Abdominal pain 09/27/2016    Past Surgical History:  Procedure Laterality Date  . APPENDECTOMY  04/27/12  . APPENDECTOMY    . LAPAROSCOPIC APPENDECTOMY  04/26/2012   Procedure: APPENDECTOMY LAPAROSCOPIC;  Surgeon: Zenovia Jarred, MD;  Location: [place];  Service: General;  Laterality: N/A;        Home Medications    Prior to Admission medications   Medication Sig Start Date End Date Taking? Authorizing Provider  benzonatate (TESSALON) 100 MG capsule Take 1 capsule (100 mg total) by mouth 3 (three) times daily as needed for cough. Patient not taking: Reported on 09/27/2016 11/07/12   Carmin Muskrat, MD  doxycycline (VIBRA-TABS) 100 MG tablet Take 1 tablet (100 mg total) by mouth every 12 (twelve) hours. Patient not taking: Reported on 01/18/2018 11/19/17   Patrecia Pour,  NP  ibuprofen (ADVIL,MOTRIN) 600 MG tablet Take 1 tablet (600 mg total) by mouth every 6 (six) hours as needed. Patient not taking: Reported on 09/27/2016 10/26/14   Jeannett Senior, PA-C    Family History Family History  Problem Relation Age of Onset  . Diabetes Mother     Social History Social History   Tobacco Use  . Smoking status: Former Smoker    Packs/day: 0.50    Types: Cigarettes  . Smokeless tobacco: Never Used  Substance Use Topics  . Alcohol use: Yes    Comment: social  . Drug use: No     Allergies   Penicillins   Review of Systems Review of Systems  All other systems reviewed and are negative.    Physical Exam Updated Vital Signs BP (!) 129/91 (BP Location: Left Arm)   Pulse 91   Temp 98.1 F (36.7 C) (Oral)   Resp 16   Ht 6' (1.829 m)   Wt 81.6 kg   SpO2 99%   BMI 24.41 kg/m   Physical Exam Vitals signs and nursing note reviewed.  Constitutional:      General: He is not in acute distress.    Appearance: He is well-developed.  HENT:     Head: Normocephalic and atraumatic.  Eyes:     Conjunctiva/sclera: Conjunctivae normal.     Pupils: Pupils are equal, round, and reactive to light.  Neck:     Musculoskeletal: Normal  range of motion and neck supple.  Cardiovascular:     Rate and Rhythm: Normal rate and regular rhythm.     Pulses: Normal pulses.     Heart sounds: No murmur.  Pulmonary:     Effort: Pulmonary effort is normal. No respiratory distress.     Breath sounds: Normal breath sounds. No wheezing or rales.  Abdominal:     General: There is no distension.     Palpations: Abdomen is soft.     Tenderness: There is no abdominal tenderness. There is no guarding or rebound.  Musculoskeletal: Normal range of motion.        General: Swelling and tenderness present.       Arms:  Skin:    General: Skin is warm and dry.     Findings: No erythema or rash.  Neurological:     General: No focal deficit present.     Mental Status: He  is alert and oriented to person, place, and time. Mental status is at baseline.  Psychiatric:        Mood and Affect: Mood normal.        Behavior: Behavior normal.        Thought Content: Thought content normal.      ED Treatments / Results  Labs (all labs ordered are listed, but only abnormal results are displayed) Labs Reviewed - No data to display  EKG None  Radiology No results found.  Procedures Procedures (including critical care time) INCISION AND DRAINAGE Performed by: Gwyneth SproutWhitney Denyce Harr Consent: Verbal consent obtained. Risks and benefits: risks, benefits and alternatives were discussed Type: abscess  Body area: right forearm  Anesthesia: local infiltration  Incision was made with a scalpel.  Local anesthetic: lidocaine 2% with epinephrine  Anesthetic total: 5 ml  Complexity: complex Blunt dissection to break up loculations  Drainage: purulent  Drainage amount: 5mL  Packing material: none  Patient tolerance: Patient tolerated the procedure well with no immediate complications.     Medications Ordered in ED Medications  lidocaine-EPINEPHrine (XYLOCAINE W/EPI) 2 %-1:200000 (PF) injection 10 mL (has no administration in time range)  HYDROcodone-acetaminophen (NORCO/VICODIN) 5-325 MG per tablet 1 tablet (has no administration in time range)     Initial Impression / Assessment and Plan / ED Course  I have reviewed the triage vital signs and the nursing notes.  Pertinent labs & imaging results that were available during my care of the patient were reviewed by me and considered in my medical decision making (see chart for details).       Patient presenting today with an abscess and cellulitis of the right forearm.  Started yesterday and started draining today.  Pointing, draining and indurated abscess present on the arm.  No systemic symptoms.  Patient has not used any IV drugs for approximately 1 year and has had abscesses in the past.  He is  otherwise well-appearing.  I&D as above.  Area marked and patient will be started on antibiotics.  Recommended return if symptoms worsen within the next 1 to 2 days.   Final Clinical Impressions(s) / ED Diagnoses   Final diagnoses:  Abscess of forearm, right  Cellulitis of right upper extremity    ED Discharge Orders         Ordered    doxycycline (VIBRAMYCIN) 100 MG capsule  2 times daily     03/09/19 1757           Gwyneth SproutPlunkett, Cyndie Woodbeck, MD 03/09/19 1757

## 2019-03-09 NOTE — Discharge Instructions (Signed)
Elevate your arm for the next few days.  Continue to do warm compresses over the area.  Take the antibiotic as prescribed.  Please return if symptoms are not getting better swelling and redness is getting worse

## 2020-11-15 ENCOUNTER — Emergency Department (HOSPITAL_COMMUNITY)
Admission: EM | Admit: 2020-11-15 | Discharge: 2020-11-15 | Disposition: A | Discharge status: Home / Self Care | Payer: Self-pay | Attending: Emergency Medicine | Admitting: Emergency Medicine

## 2020-11-15 ENCOUNTER — Emergency Department (HOSPITAL_COMMUNITY): Payer: Self-pay

## 2020-11-15 ENCOUNTER — Encounter (HOSPITAL_COMMUNITY): Payer: Self-pay

## 2020-11-15 ENCOUNTER — Other Ambulatory Visit: Payer: Self-pay

## 2020-11-15 DIAGNOSIS — F15929 Other stimulant use, unspecified with intoxication, unspecified: Secondary | ICD-10-CM

## 2020-11-15 DIAGNOSIS — R000 Tachycardia, unspecified: Secondary | ICD-10-CM | POA: Insufficient documentation

## 2020-11-15 DIAGNOSIS — R079 Chest pain, unspecified: Secondary | ICD-10-CM | POA: Insufficient documentation

## 2020-11-15 DIAGNOSIS — Y92410 Unspecified street and highway as the place of occurrence of the external cause: Secondary | ICD-10-CM | POA: Insufficient documentation

## 2020-11-15 DIAGNOSIS — Z87891 Personal history of nicotine dependence: Secondary | ICD-10-CM | POA: Insufficient documentation

## 2020-11-15 DIAGNOSIS — F15129 Other stimulant abuse with intoxication, unspecified: Secondary | ICD-10-CM | POA: Insufficient documentation

## 2020-11-15 DIAGNOSIS — R4182 Altered mental status, unspecified: Secondary | ICD-10-CM | POA: Insufficient documentation

## 2020-11-15 LAB — COMPREHENSIVE METABOLIC PANEL
ALT: 105 U/L — ABNORMAL HIGH (ref 0–44)
AST: 88 U/L — ABNORMAL HIGH (ref 15–41)
Albumin: 3.8 g/dL (ref 3.5–5.0)
Alkaline Phosphatase: 56 U/L (ref 38–126)
Anion gap: 12 (ref 5–15)
BUN: 7 mg/dL (ref 6–20)
CO2: 19 mmol/L — ABNORMAL LOW (ref 22–32)
Calcium: 9.5 mg/dL (ref 8.9–10.3)
Chloride: 106 mmol/L (ref 98–111)
Creatinine, Ser: 1.06 mg/dL (ref 0.61–1.24)
GFR, Estimated: >60 mL/min (ref >60)
Glucose, Bld: 91 mg/dL (ref 70–99)
Potassium: 4.4 mmol/L (ref 3.5–5.1)
Sodium: 137 mmol/L (ref 135–145)
Total Bilirubin: 0.4 mg/dL (ref 0.3–1.2)
Total Protein: 7.2 g/dL (ref 6.5–8.1)

## 2020-11-15 LAB — SAMPLE TO BLOOD BANK

## 2020-11-15 LAB — URINALYSIS, ROUTINE W REFLEX MICROSCOPIC
Bacteria, UA: NONE SEEN
Bilirubin Urine: NEGATIVE
Glucose, UA: NEGATIVE mg/dL
Hgb urine dipstick: NEGATIVE
Ketones, ur: NEGATIVE mg/dL
Leukocytes,Ua: NEGATIVE
Nitrite: NEGATIVE
Protein, ur: NEGATIVE mg/dL
Specific Gravity, Urine: 1.035 — ABNORMAL HIGH (ref 1.005–1.030)
pH: 6 (ref 5.0–8.0)

## 2020-11-15 LAB — CBC
HCT: 48 % (ref 39.0–52.0)
Hemoglobin: 15.9 g/dL (ref 13.0–17.0)
MCH: 26.3 pg (ref 26.0–34.0)
MCHC: 33.1 g/dL (ref 30.0–36.0)
MCV: 79.3 fL — ABNORMAL LOW (ref 80.0–100.0)
Platelets: 315 10*3/uL (ref 150–400)
RBC: 6.05 MIL/uL — ABNORMAL HIGH (ref 4.22–5.81)
RDW: 13.1 % (ref 11.5–15.5)
WBC: 10.7 10*3/uL — ABNORMAL HIGH (ref 4.0–10.5)
nRBC: 0 % (ref 0.0–0.2)

## 2020-11-15 LAB — I-STAT CHEM 8, ED
BUN: 7 mg/dL (ref 6–20)
Calcium, Ion: 1.13 mmol/L — ABNORMAL LOW (ref 1.15–1.40)
Chloride: 104 mmol/L (ref 98–111)
Creatinine, Ser: 0.9 mg/dL (ref 0.61–1.24)
Glucose, Bld: 94 mg/dL (ref 70–99)
HCT: 49 % (ref 39.0–52.0)
Hemoglobin: 16.7 g/dL (ref 13.0–17.0)
Potassium: 4.1 mmol/L (ref 3.5–5.1)
Sodium: 139 mmol/L (ref 135–145)
TCO2: 22 mmol/L (ref 22–32)

## 2020-11-15 LAB — PROTIME-INR
INR: 1.1 (ref 0.8–1.2)
Prothrombin Time: 13.8 seconds (ref 11.4–15.2)

## 2020-11-15 LAB — ETHANOL: Alcohol, Ethyl (B): <10 mg/dL (ref <10)

## 2020-11-15 LAB — LACTIC ACID, PLASMA
Lactic Acid, Venous: 5.2 mmol/L (ref 0.5–1.9)
Lactic Acid, Venous: 0.7 mmol/L (ref 0.5–1.9)

## 2020-11-15 MED ORDER — SODIUM CHLORIDE 0.9 % IV BOLUS (SEPSIS)
1000.0000 mL | Freq: Once | INTRAVENOUS | Status: AC
Start: 2020-11-15 — End: 2020-11-15
  Administered 2020-11-15: 1000 mL via INTRAVENOUS

## 2020-11-15 MED ORDER — SODIUM CHLORIDE 0.9 % IV SOLN: 1000.0000 mL | INTRAVENOUS | Status: DC

## 2020-11-15 MED ORDER — SODIUM CHLORIDE 0.9 % IV BOLUS (SEPSIS)
1000.0000 mL | Freq: Once | INTRAVENOUS | Status: AC
  Administered 2020-11-15: 1000 mL via INTRAVENOUS

## 2020-11-15 MED ORDER — IOHEXOL 300 MG/ML  SOLN
100.0000 mL | Freq: Once | INTRAMUSCULAR | Status: AC | PRN
  Administered 2020-11-15: 100 mL via INTRAVENOUS

## 2020-11-15 MED ORDER — SODIUM CHLORIDE 0.9 % IV SOLN
1000.0000 mL | INTRAVENOUS | Status: DC
  Administered 2020-11-15: 1000 mL via INTRAVENOUS
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
## 2020-12-30 ENCOUNTER — Emergency Department (HOSPITAL_COMMUNITY): Admission: EM | Admit: 2020-12-30 | Payer: Self-pay | Source: Home / Self Care

## 2020-12-30 ENCOUNTER — Emergency Department (HOSPITAL_COMMUNITY): Payer: Self-pay

## 2020-12-30 ENCOUNTER — Inpatient Hospital Stay (HOSPITAL_COMMUNITY)
Admission: EM | Admit: 2020-12-30 | Discharge: 2021-01-09 | DRG: 871 | Disposition: A | Discharge status: Home / Self Care | Payer: Self-pay | Attending: Family Medicine | Admitting: Family Medicine

## 2020-12-30 DIAGNOSIS — G928 Other toxic encephalopathy: Secondary | ICD-10-CM | POA: Diagnosis present

## 2020-12-30 DIAGNOSIS — J154 Pneumonia due to other streptococci: Secondary | ICD-10-CM | POA: Diagnosis present

## 2020-12-30 DIAGNOSIS — Z818 Family history of other mental and behavioral disorders: Secondary | ICD-10-CM

## 2020-12-30 DIAGNOSIS — G9340 Encephalopathy, unspecified: Secondary | ICD-10-CM

## 2020-12-30 DIAGNOSIS — L219 Seborrheic dermatitis, unspecified: Secondary | ICD-10-CM | POA: Diagnosis present

## 2020-12-30 DIAGNOSIS — J9601 Acute respiratory failure with hypoxia: Secondary | ICD-10-CM | POA: Diagnosis present

## 2020-12-30 DIAGNOSIS — J690 Pneumonitis due to inhalation of food and vomit: Secondary | ICD-10-CM | POA: Diagnosis not present

## 2020-12-30 DIAGNOSIS — Z781 Physical restraint status: Secondary | ICD-10-CM

## 2020-12-30 DIAGNOSIS — A409 Streptococcal sepsis, unspecified: Principal | ICD-10-CM | POA: Diagnosis present

## 2020-12-30 DIAGNOSIS — F1510 Other stimulant abuse, uncomplicated: Secondary | ICD-10-CM

## 2020-12-30 DIAGNOSIS — R945 Abnormal results of liver function studies: Secondary | ICD-10-CM

## 2020-12-30 DIAGNOSIS — E875 Hyperkalemia: Secondary | ICD-10-CM

## 2020-12-30 DIAGNOSIS — Z20822 Contact with and (suspected) exposure to covid-19: Secondary | ICD-10-CM | POA: Diagnosis present

## 2020-12-30 DIAGNOSIS — E872 Acidosis: Secondary | ICD-10-CM | POA: Diagnosis present

## 2020-12-30 DIAGNOSIS — T40711A Poisoning by cannabis, accidental (unintentional), initial encounter: Secondary | ICD-10-CM | POA: Diagnosis present

## 2020-12-30 DIAGNOSIS — N179 Acute kidney failure, unspecified: Secondary | ICD-10-CM

## 2020-12-30 DIAGNOSIS — T426X5A Adverse effect of other antiepileptic and sedative-hypnotic drugs, initial encounter: Secondary | ICD-10-CM | POA: Diagnosis not present

## 2020-12-30 DIAGNOSIS — H02402 Unspecified ptosis of left eyelid: Secondary | ICD-10-CM | POA: Diagnosis present

## 2020-12-30 DIAGNOSIS — T424X1A Poisoning by benzodiazepines, accidental (unintentional), initial encounter: Secondary | ICD-10-CM | POA: Diagnosis present

## 2020-12-30 DIAGNOSIS — J189 Pneumonia, unspecified organism: Secondary | ICD-10-CM

## 2020-12-30 DIAGNOSIS — R739 Hyperglycemia, unspecified: Secondary | ICD-10-CM | POA: Diagnosis present

## 2020-12-30 DIAGNOSIS — I959 Hypotension, unspecified: Secondary | ICD-10-CM | POA: Diagnosis not present

## 2020-12-30 DIAGNOSIS — B1920 Unspecified viral hepatitis C without hepatic coma: Secondary | ICD-10-CM | POA: Diagnosis present

## 2020-12-30 DIAGNOSIS — Z880 Allergy status to penicillin: Secondary | ICD-10-CM

## 2020-12-30 DIAGNOSIS — R45850 Homicidal ideations: Secondary | ICD-10-CM | POA: Diagnosis not present

## 2020-12-30 DIAGNOSIS — K7200 Acute and subacute hepatic failure without coma: Secondary | ICD-10-CM | POA: Diagnosis present

## 2020-12-30 DIAGNOSIS — F909 Attention-deficit hyperactivity disorder, unspecified type: Secondary | ICD-10-CM | POA: Diagnosis present

## 2020-12-30 DIAGNOSIS — R0902 Hypoxemia: Secondary | ICD-10-CM

## 2020-12-30 DIAGNOSIS — F17210 Nicotine dependence, cigarettes, uncomplicated: Secondary | ICD-10-CM | POA: Diagnosis present

## 2020-12-30 DIAGNOSIS — M6282 Rhabdomyolysis: Secondary | ICD-10-CM | POA: Diagnosis present

## 2020-12-30 DIAGNOSIS — L8990 Pressure ulcer of unspecified site, unspecified stage: Secondary | ICD-10-CM | POA: Insufficient documentation

## 2020-12-30 DIAGNOSIS — B009 Herpesviral infection, unspecified: Secondary | ICD-10-CM | POA: Diagnosis not present

## 2020-12-30 DIAGNOSIS — R1310 Dysphagia, unspecified: Secondary | ICD-10-CM | POA: Diagnosis not present

## 2020-12-30 DIAGNOSIS — T17908A Unspecified foreign body in respiratory tract, part unspecified causing other injury, initial encounter: Secondary | ICD-10-CM

## 2020-12-30 DIAGNOSIS — F10239 Alcohol dependence with withdrawal, unspecified: Secondary | ICD-10-CM | POA: Diagnosis not present

## 2020-12-30 DIAGNOSIS — T43621A Poisoning by amphetamines, accidental (unintentional), initial encounter: Secondary | ICD-10-CM | POA: Diagnosis present

## 2020-12-30 DIAGNOSIS — J13 Pneumonia due to Streptococcus pneumoniae: Secondary | ICD-10-CM | POA: Diagnosis present

## 2020-12-30 HISTORY — DX: Bronchitis, not specified as acute or chronic: J40

## 2020-12-30 LAB — RAPID URINE DRUG SCREEN, HOSP PERFORMED
Amphetamines: POSITIVE — AB
Barbiturates: NOT DETECTED
Benzodiazepines: POSITIVE — AB
Cocaine: NOT DETECTED
Opiates: NOT DETECTED
Tetrahydrocannabinol: POSITIVE — AB

## 2020-12-30 LAB — URINALYSIS, ROUTINE W REFLEX MICROSCOPIC
Bilirubin Urine: NEGATIVE
Glucose, UA: NEGATIVE mg/dL
Ketones, ur: NEGATIVE mg/dL
Leukocytes,Ua: NEGATIVE
Nitrite: NEGATIVE
Protein, ur: 30 mg/dL — AB
Specific Gravity, Urine: 1.018 (ref 1.005–1.030)
pH: 5 (ref 5.0–8.0)

## 2020-12-30 LAB — PROTIME-INR
INR: 1.3 — ABNORMAL HIGH (ref 0.8–1.2)
Prothrombin Time: 16.3 seconds — ABNORMAL HIGH (ref 11.4–15.2)

## 2020-12-30 LAB — RESP PANEL BY RT-PCR (FLU A&B, COVID) ARPGX2
Influenza A by PCR: NEGATIVE
Influenza B by PCR: NEGATIVE
SARS Coronavirus 2 by RT PCR: NEGATIVE

## 2020-12-30 LAB — CBC WITH DIFFERENTIAL/PLATELET
Abs Immature Granulocytes: 0.06 10*3/uL (ref 0.00–0.07)
Basophils Absolute: 0 10*3/uL (ref 0.0–0.1)
Basophils Relative: 0 %
Eosinophils Absolute: 0 10*3/uL (ref 0.0–0.5)
Eosinophils Relative: 0 %
HCT: 51.9 % (ref 39.0–52.0)
Hemoglobin: 16.3 g/dL (ref 13.0–17.0)
Immature Granulocytes: 0 %
Lymphocytes Relative: 12 %
Lymphs Abs: 1.9 10*3/uL (ref 0.7–4.0)
MCH: 26.8 pg (ref 26.0–34.0)
MCHC: 31.4 g/dL (ref 30.0–36.0)
MCV: 85.4 fL (ref 80.0–100.0)
Monocytes Absolute: 1.7 10*3/uL — ABNORMAL HIGH (ref 0.1–1.0)
Monocytes Relative: 11 %
Neutro Abs: 11.9 10*3/uL — ABNORMAL HIGH (ref 1.7–7.7)
Neutrophils Relative %: 77 %
Platelets: 284 10*3/uL (ref 150–400)
RBC: 6.08 MIL/uL — ABNORMAL HIGH (ref 4.22–5.81)
RDW: 13.4 % (ref 11.5–15.5)
WBC: 15.5 10*3/uL — ABNORMAL HIGH (ref 4.0–10.5)
nRBC: 0 % (ref 0.0–0.2)

## 2020-12-30 LAB — COMPREHENSIVE METABOLIC PANEL
ALT: 246 U/L — ABNORMAL HIGH (ref 0–44)
AST: 212 U/L — ABNORMAL HIGH (ref 15–41)
Albumin: 4.4 g/dL (ref 3.5–5.0)
Alkaline Phosphatase: 64 U/L (ref 38–126)
Anion gap: 11 (ref 5–15)
BUN: 30 mg/dL — ABNORMAL HIGH (ref 6–20)
CO2: 22 mmol/L (ref 22–32)
Calcium: 7.8 mg/dL — ABNORMAL LOW (ref 8.9–10.3)
Chloride: 107 mmol/L (ref 98–111)
Creatinine, Ser: 2.91 mg/dL — ABNORMAL HIGH (ref 0.61–1.24)
GFR, Estimated: 14 mL/min — ABNORMAL LOW (ref >60)
Glucose, Bld: 104 mg/dL — ABNORMAL HIGH (ref 70–99)
Potassium: 5.9 mmol/L — ABNORMAL HIGH (ref 3.5–5.1)
Sodium: 140 mmol/L (ref 135–145)
Total Bilirubin: 0.8 mg/dL (ref 0.3–1.2)
Total Protein: 8 g/dL (ref 6.5–8.1)

## 2020-12-30 LAB — AMMONIA: Ammonia: 42 umol/L — ABNORMAL HIGH (ref 9–35)

## 2020-12-30 LAB — LACTIC ACID, PLASMA
Lactic Acid, Venous: 4.9 mmol/L (ref 0.5–1.9)
Lactic Acid, Venous: 3.9 mmol/L (ref 0.5–1.9)

## 2020-12-30 LAB — CBG MONITORING, ED: Glucose-Capillary: 97 mg/dL (ref 70–99)

## 2020-12-30 LAB — ETHANOL: Alcohol, Ethyl (B): <10 mg/dL (ref <10)

## 2020-12-30 LAB — APTT: aPTT: 28 seconds (ref 24–36)

## 2020-12-30 LAB — CK: Total CK: 649 U/L — ABNORMAL HIGH (ref 49–397)

## 2020-12-30 MED ORDER — SODIUM CHLORIDE 0.9 % IV SOLN
2.0000 g | Freq: Once | INTRAVENOUS | Status: AC
  Administered 2020-12-30: 2 g via INTRAVENOUS
  Filled 2020-12-30: qty 2

## 2020-12-30 MED ORDER — DEXMEDETOMIDINE HCL IN NACL 200 MCG/50ML IV SOLN
0.4000 ug/kg/h | INTRAVENOUS | Status: DC
  Administered 2020-12-31: 1.2 ug/kg/h via INTRAVENOUS
  Administered 2020-12-31 (×2): 0.5 ug/kg/h via INTRAVENOUS
  Filled 2020-12-30 (×4): qty 50

## 2020-12-30 MED ORDER — ACETAMINOPHEN 650 MG RE SUPP
650.0000 mg | Freq: Once | RECTAL | Status: AC
  Administered 2020-12-30: 650 mg via RECTAL
  Filled 2020-12-30: qty 1

## 2020-12-30 MED ORDER — VANCOMYCIN HCL IN DEXTROSE 1-5 GM/200ML-% IV SOLN
1000.0000 mg | Freq: Once | INTRAVENOUS | Status: AC
  Administered 2020-12-30: 1000 mg via INTRAVENOUS
  Filled 2020-12-30: qty 200

## 2020-12-30 MED ORDER — SODIUM CHLORIDE 0.9 % IV SOLN
2.0000 g | Freq: Once | INTRAVENOUS | Status: AC
  Administered 2020-12-31: 2 g via INTRAVENOUS
  Filled 2020-12-30: qty 2

## 2020-12-30 MED ORDER — LACTATED RINGERS IV SOLN: INTRAVENOUS | Status: DC

## 2020-12-30 MED ORDER — DEXTROSE IN LACTATED RINGERS 5 % IV SOLN: INTRAVENOUS | Status: DC

## 2020-12-30 MED ORDER — LORAZEPAM 2 MG/ML IJ SOLN
INTRAMUSCULAR | Status: AC
  Filled 2020-12-30: qty 1

## 2020-12-30 MED ORDER — THIAMINE HCL 100 MG/ML IJ SOLN
100.0000 mg | Freq: Every day | INTRAMUSCULAR | Status: DC
  Administered 2020-12-31 – 2021-01-07 (×8): 100 mg via INTRAVENOUS
  Filled 2020-12-30 (×9): qty 2

## 2020-12-30 MED ORDER — POLYETHYLENE GLYCOL 3350 17 G PO PACK: 17.0000 g | PACK | Freq: Every day | ORAL | Status: DC | PRN

## 2020-12-30 MED ORDER — LACTATED RINGERS IV BOLUS (SEPSIS)
1000.0000 mL | Freq: Once | INTRAVENOUS | Status: AC
  Administered 2020-12-30: 1000 mL via INTRAVENOUS

## 2020-12-30 MED ORDER — DOCUSATE SODIUM 100 MG PO CAPS: 100.0000 mg | ORAL_CAPSULE | Freq: Two times a day (BID) | ORAL | Status: DC | PRN

## 2020-12-30 MED ORDER — LORAZEPAM 2 MG/ML IJ SOLN: 2.0000 mg | Freq: Once | INTRAMUSCULAR | Status: AC

## 2020-12-30 MED ORDER — LORAZEPAM 2 MG/ML IJ SOLN
INTRAMUSCULAR | Status: AC
  Administered 2020-12-30: 2 mg via INTRAVENOUS
  Filled 2020-12-30: qty 1

## 2020-12-30 MED ORDER — SODIUM CHLORIDE 0.9 % IV SOLN
1.0000 mg | Freq: Once | INTRAVENOUS | Status: AC
  Administered 2020-12-31: 1 mg via INTRAVENOUS
  Filled 2020-12-30: qty 0.2

## 2020-12-30 MED ORDER — METRONIDAZOLE 500 MG/100ML IV SOLN
500.0000 mg | Freq: Once | INTRAVENOUS | Status: AC
  Administered 2020-12-30: 500 mg via INTRAVENOUS
  Filled 2020-12-30: qty 100

## 2020-12-30 MED ORDER — LORAZEPAM 2 MG/ML IJ SOLN
1.0000 mg | Freq: Once | INTRAMUSCULAR | Status: AC
  Administered 2020-12-30: 1 mg via INTRAVENOUS

## 2020-12-30 MED ORDER — LACTATED RINGERS IV BOLUS
2000.0000 mL | Freq: Once | INTRAVENOUS | Status: AC
  Administered 2020-12-31 (×2): 2000 mL via INTRAVENOUS

## 2020-12-30 MED ORDER — HEPARIN SODIUM (PORCINE) 5000 UNIT/ML IJ SOLN
5000.0000 [IU] | Freq: Three times a day (TID) | INTRAMUSCULAR | Status: DC
  Administered 2020-12-31 – 2021-01-08 (×25): 5000 [IU] via SUBCUTANEOUS
  Filled 2020-12-30 (×26): qty 1

## 2020-12-30 MED ORDER — PANTOPRAZOLE SODIUM 40 MG IV SOLR
40.0000 mg | Freq: Every day | INTRAVENOUS | Status: DC
  Administered 2020-12-31 – 2021-01-07 (×9): 40 mg via INTRAVENOUS
  Filled 2020-12-30 (×8): qty 40

## 2020-12-30 MED ORDER — SODIUM CHLORIDE 0.9 % IV SOLN
500.0000 mg | INTRAVENOUS | Status: DC
  Administered 2020-12-31: 500 mg via INTRAVENOUS
  Filled 2020-12-30: qty 500

## 2020-12-30 NOTE — Sepsis Progress Note (Signed)
Unable to obtain weight at this time. Bedside nurse best guess of weight is around 160 lbs. Three liters of fluid should well cover that.
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
## 2020-12-30 NOTE — Progress Notes (Signed)
A consult was received from an ED physician for vancomycin and cefepime per pharmacy dosing.  The patient's profile has been reviewed for ht/wt/allergies/indication/available labs. No weight in chart - pt brought in unresponsive, no allergy information.  A one time order has been placed for cefepime 2 g IV once + vancomycin 1000 mg IV once.    Further antibiotics/pharmacy consults should be ordered by admitting physician if indicated.                       Thank you, Cindi Carbon, PharmD 12/30/2020  5:15 PM

## 2020-12-30 NOTE — ED Notes (Signed)
Patient is using a condom cath to void. Patient becomes agitated and thrashes around in the bed. Patient is nonverbal but responds to sternal rubs and pain. Patient is using 15L HFNC.

## 2020-12-30 NOTE — Sepsis Progress Note (Signed)
Secure chat with bedside nurse about obtaining weight if possible so that he can get the proper amount of fluids per sepsis protocol.

## 2020-12-30 NOTE — Sepsis Progress Note (Signed)
Sepsis protocol is being monitored by eLink. 

## 2020-12-30 NOTE — ED Notes (Addendum)
Called lab about 2nd lactic that was sent. Stated they did not have an order. Advised the charts have been merged and they needed to use the current MRN. Lab stated they will received it
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
## 2020-12-31 ENCOUNTER — Encounter (HOSPITAL_COMMUNITY): Payer: Self-pay | Admitting: Internal Medicine

## 2020-12-31 DIAGNOSIS — J9601 Acute respiratory failure with hypoxia: Secondary | ICD-10-CM

## 2020-12-31 DIAGNOSIS — A491 Streptococcal infection, unspecified site: Secondary | ICD-10-CM

## 2020-12-31 LAB — SALICYLATE LEVEL: Salicylate Lvl: <7 mg/dL — ABNORMAL LOW (ref 7.0–30.0)

## 2020-12-31 LAB — CBG MONITORING, ED
Glucose-Capillary: 290 mg/dL — ABNORMAL HIGH (ref 70–99)
Glucose-Capillary: 209 mg/dL — ABNORMAL HIGH (ref 70–99)

## 2020-12-31 LAB — CBC
HCT: 41.8 % (ref 39.0–52.0)
HCT: 43.6 % (ref 39.0–52.0)
Hemoglobin: 13.5 g/dL (ref 13.0–17.0)
Hemoglobin: 13.7 g/dL (ref 13.0–17.0)
MCH: 27.2 pg (ref 26.0–34.0)
MCH: 26.7 pg (ref 26.0–34.0)
MCHC: 32.3 g/dL (ref 30.0–36.0)
MCHC: 31.4 g/dL (ref 30.0–36.0)
MCV: 84.1 fL (ref 80.0–100.0)
MCV: 84.8 fL (ref 80.0–100.0)
Platelets: 182 10*3/uL (ref 150–400)
Platelets: 178 10*3/uL (ref 150–400)
RBC: 4.97 MIL/uL (ref 4.22–5.81)
RBC: 5.14 MIL/uL (ref 4.22–5.81)
RDW: 13.4 % (ref 11.5–15.5)
RDW: 13.2 % (ref 11.5–15.5)
WBC: 12.6 10*3/uL — ABNORMAL HIGH (ref 4.0–10.5)
WBC: 13.8 10*3/uL — ABNORMAL HIGH (ref 4.0–10.5)
nRBC: 0 % (ref 0.0–0.2)
nRBC: 0 % (ref 0.0–0.2)

## 2020-12-31 LAB — COMPREHENSIVE METABOLIC PANEL
ALT: 190 U/L — ABNORMAL HIGH (ref 0–44)
AST: 196 U/L — ABNORMAL HIGH (ref 15–41)
Albumin: 3 g/dL — ABNORMAL LOW (ref 3.5–5.0)
Alkaline Phosphatase: 37 U/L — ABNORMAL LOW (ref 38–126)
Anion gap: 9 (ref 5–15)
BUN: 22 mg/dL — ABNORMAL HIGH (ref 6–20)
CO2: 23 mmol/L (ref 22–32)
Calcium: 7.9 mg/dL — ABNORMAL LOW (ref 8.9–10.3)
Chloride: 104 mmol/L (ref 98–111)
Creatinine, Ser: 1.18 mg/dL (ref 0.61–1.24)
GFR, Estimated: >60 mL/min (ref >60)
Glucose, Bld: 216 mg/dL — ABNORMAL HIGH (ref 70–99)
Potassium: 4.1 mmol/L (ref 3.5–5.1)
Sodium: 136 mmol/L (ref 135–145)
Total Bilirubin: 1.3 mg/dL — ABNORMAL HIGH (ref 0.3–1.2)
Total Protein: 5.6 g/dL — ABNORMAL LOW (ref 6.5–8.1)

## 2020-12-31 LAB — BASIC METABOLIC PANEL
Anion gap: 8 (ref 5–15)
BUN: 21 mg/dL — ABNORMAL HIGH (ref 6–20)
CO2: 23 mmol/L (ref 22–32)
Calcium: 8.2 mg/dL — ABNORMAL LOW (ref 8.9–10.3)
Chloride: 104 mmol/L (ref 98–111)
Creatinine, Ser: 1.11 mg/dL (ref 0.61–1.24)
GFR, Estimated: >60 mL/min (ref >60)
Glucose, Bld: 108 mg/dL — ABNORMAL HIGH (ref 70–99)
Potassium: 5.4 mmol/L — ABNORMAL HIGH (ref 3.5–5.1)
Sodium: 135 mmol/L (ref 135–145)

## 2020-12-31 LAB — CK TOTAL AND CKMB (NOT AT ARMC)
CK, MB: 71.1 ng/mL — ABNORMAL HIGH (ref 0.5–5.0)
Relative Index: 3 — ABNORMAL HIGH (ref 0.0–2.5)
Total CK: 2401 U/L — ABNORMAL HIGH (ref 49–397)

## 2020-12-31 LAB — CK: Total CK: 1678 U/L — ABNORMAL HIGH (ref 49–397)

## 2020-12-31 LAB — LACTIC ACID, PLASMA
Lactic Acid, Venous: 3.2 mmol/L (ref 0.5–1.9)
Lactic Acid, Venous: 1.4 mmol/L (ref 0.5–1.9)

## 2020-12-31 LAB — BASIC METABOLIC PANEL WITH GFR
Anion gap: 4 — ABNORMAL LOW (ref 5–15)
BUN: 20 mg/dL (ref 6–20)
CO2: 30 mmol/L (ref 22–32)
Calcium: 8.3 mg/dL — ABNORMAL LOW (ref 8.9–10.3)
Chloride: 103 mmol/L (ref 98–111)
Creatinine, Ser: 1 mg/dL (ref 0.61–1.24)
GFR, Estimated: >60 mL/min
Glucose, Bld: 92 mg/dL (ref 70–99)
Potassium: 5.1 mmol/L (ref 3.5–5.1)
Sodium: 137 mmol/L (ref 135–145)

## 2020-12-31 LAB — GLUCOSE, CAPILLARY
Glucose-Capillary: 117 mg/dL — ABNORMAL HIGH (ref 70–99)
Glucose-Capillary: 123 mg/dL — ABNORMAL HIGH (ref 70–99)
Glucose-Capillary: 81 mg/dL (ref 70–99)
Glucose-Capillary: 94 mg/dL (ref 70–99)
Glucose-Capillary: 99 mg/dL (ref 70–99)
Glucose-Capillary: 99 mg/dL (ref 70–99)

## 2020-12-31 LAB — PHOSPHORUS
Phosphorus: 3.1 mg/dL (ref 2.5–4.6)
Phosphorus: 3.6 mg/dL (ref 2.5–4.6)

## 2020-12-31 LAB — TROPONIN I (HIGH SENSITIVITY)
Troponin I (High Sensitivity): 245 ng/L (ref <18)
Troponin I (High Sensitivity): 111 ng/L (ref <18)
Troponin I (High Sensitivity): 85 ng/L — ABNORMAL HIGH (ref <18)

## 2020-12-31 LAB — STREP PNEUMONIAE URINARY ANTIGEN: Strep Pneumo Urinary Antigen: POSITIVE — AB

## 2020-12-31 LAB — MAGNESIUM
Magnesium: 1.7 mg/dL (ref 1.7–2.4)
Magnesium: 1.8 mg/dL (ref 1.7–2.4)

## 2020-12-31 LAB — MRSA PCR SCREENING: MRSA by PCR: POSITIVE — AB

## 2020-12-31 LAB — ACETAMINOPHEN LEVEL: Acetaminophen (Tylenol), Serum: <10 ug/mL — ABNORMAL LOW (ref 10–30)

## 2020-12-31 LAB — PROTIME-INR
INR: 1.2 (ref 0.8–1.2)
Prothrombin Time: 15.4 seconds — ABNORMAL HIGH (ref 11.4–15.2)

## 2020-12-31 LAB — PROCALCITONIN: Procalcitonin: 45.22 ng/mL

## 2020-12-31 LAB — HIV ANTIBODY (ROUTINE TESTING W REFLEX): HIV Screen 4th Generation wRfx: NONREACTIVE

## 2020-12-31 MED ORDER — DEXMEDETOMIDINE HCL IN NACL 400 MCG/100ML IV SOLN
0.4000 ug/kg/h | INTRAVENOUS | Status: DC
  Administered 2020-12-31: 1.1 ug/kg/h via INTRAVENOUS
  Administered 2021-01-01: 1.2 ug/kg/h via INTRAVENOUS
  Administered 2021-01-01 (×2): 1.1 ug/kg/h via INTRAVENOUS
  Administered 2021-01-01: 1.2 ug/kg/h via INTRAVENOUS
  Administered 2021-01-01: 1.1 ug/kg/h via INTRAVENOUS
  Administered 2021-01-02 (×2): 1.2 ug/kg/h via INTRAVENOUS
  Administered 2021-01-02: 1 ug/kg/h via INTRAVENOUS
  Administered 2021-01-02: 0.8 ug/kg/h via INTRAVENOUS
  Administered 2021-01-03: 0.6 ug/kg/h via INTRAVENOUS
  Filled 2020-12-31 (×5): qty 100
  Filled 2020-12-31: qty 200
  Filled 2020-12-31: qty 100
  Filled 2020-12-31: qty 200
  Filled 2020-12-31 (×2): qty 100

## 2020-12-31 MED ORDER — LACTATED RINGERS IV SOLN: INTRAVENOUS | Status: DC

## 2020-12-31 MED ORDER — NOREPINEPHRINE 4 MG/250ML-% IV SOLN
2.0000 ug/min | INTRAVENOUS | Status: DC
Start: 2020-12-31 — End: 2021-01-01

## 2020-12-31 MED ORDER — INSULIN ASPART 100 UNIT/ML IJ SOLN
0.0000 [IU] | INTRAMUSCULAR | Status: DC
  Administered 2020-12-31: 1 [IU] via SUBCUTANEOUS

## 2020-12-31 MED ORDER — ORAL CARE MOUTH RINSE
15.0000 mL | Freq: Two times a day (BID) | OROMUCOSAL | Status: DC
  Administered 2020-12-31 – 2021-01-02 (×4): 15 mL via OROMUCOSAL

## 2020-12-31 MED ORDER — LORAZEPAM 2 MG/ML IJ SOLN
0.5000 mg | INTRAMUSCULAR | Status: DC | PRN
Start: 2020-12-31 — End: 2020-12-31
  Administered 2020-12-31 (×2): 2 mg via INTRAVENOUS
  Filled 2020-12-31 (×2): qty 1

## 2020-12-31 MED ORDER — SODIUM ZIRCONIUM CYCLOSILICATE 10 G PO PACK: 10.0000 g | PACK | Freq: Two times a day (BID) | ORAL | Status: AC

## 2020-12-31 MED ORDER — FOLIC ACID 5 MG/ML IJ SOLN
1.0000 mg | Freq: Every day | INTRAMUSCULAR | Status: DC
  Administered 2021-01-01 – 2021-01-07 (×7): 1 mg via INTRAVENOUS
  Filled 2020-12-31 (×8): qty 0.2

## 2020-12-31 MED ORDER — SODIUM CHLORIDE 0.9 % IV SOLN
2.0000 g | INTRAVENOUS | Status: DC
  Administered 2020-12-31 – 2021-01-02 (×3): 2 g via INTRAVENOUS
  Filled 2020-12-31: qty 2
  Filled 2020-12-31 (×3): qty 20

## 2020-12-31 MED ORDER — NOREPINEPHRINE 4 MG/250ML-% IV SOLN: 0.0000 ug/min | INTRAVENOUS | Status: DC

## 2020-12-31 MED ORDER — CALCIUM GLUCONATE-NACL 1-0.675 GM/50ML-% IV SOLN
1.0000 g | Freq: Once | INTRAVENOUS | Status: AC
  Administered 2020-12-31: 1000 mg via INTRAVENOUS
  Filled 2020-12-31: qty 50

## 2020-12-31 MED ORDER — SODIUM CHLORIDE 0.9 % IV SOLN: 2.0000 g | Freq: Two times a day (BID) | INTRAVENOUS | Status: DC

## 2020-12-31 MED ORDER — SODIUM CHLORIDE 0.9 % IV SOLN
250.0000 mL | INTRAVENOUS | Status: DC
  Administered 2021-01-04: 250 mL via INTRAVENOUS

## 2020-12-31 MED ORDER — MUPIROCIN 2 % EX OINT
1.0000 "application " | TOPICAL_OINTMENT | Freq: Two times a day (BID) | CUTANEOUS | Status: AC
  Administered 2020-12-31 – 2021-01-04 (×10): 1 via NASAL
  Filled 2020-12-31 (×3): qty 22

## 2020-12-31 MED ORDER — CHLORHEXIDINE GLUCONATE CLOTH 2 % EX PADS
6.0000 | MEDICATED_PAD | Freq: Every day | CUTANEOUS | Status: DC
  Administered 2020-12-31 – 2021-01-07 (×8): 6 via TOPICAL

## 2020-12-31 MED ORDER — LORAZEPAM 2 MG/ML IJ SOLN
0.5000 mg | INTRAMUSCULAR | Status: DC | PRN
  Administered 2020-12-31 – 2021-01-01 (×5): 2 mg via INTRAVENOUS
  Administered 2021-01-02 (×2): 4 mg via INTRAVENOUS
  Administered 2021-01-02: 2 mg via INTRAVENOUS
  Administered 2021-01-02: 4 mg via INTRAVENOUS
  Administered 2021-01-02 – 2021-01-03 (×2): 2 mg via INTRAVENOUS
  Filled 2020-12-31 (×5): qty 1
  Filled 2020-12-31: qty 2
  Filled 2020-12-31 (×3): qty 1
  Filled 2020-12-31 (×2): qty 2
  Filled 2020-12-31: qty 1
  Filled 2020-12-31 (×3): qty 2
  Filled 2020-12-31: qty 1

## 2020-12-31 NOTE — Progress Notes (Signed)
eLink Physician-Brief Progress Note Patient Name: Francis Walters DOB: [birth year] MRN: [id number]   Date of Service  12/31/2020  HPI/Events of Note  Hyperglycemia - Blood glucose = 209 and 2. Hypocalcemia - Ca++ - 7.9 which corrects to 8.7 (Low) given albumin = 3.0.   eICU Interventions  Plan: Q 4 hour sensitive Novolog SSI. Replace Ca++.     Intervention Category Major Interventions: Hyperglycemia - active titration of insulin therapy  Camren Henthorn [place] 12/31/2020, 3:08 AM
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
## 2020-12-31 NOTE — Progress Notes (Signed)
eLink Physician-Brief Progress Note Patient Name: Francis Walters DOB: 09/08/1990 MRN: [id number]   Date of Service  12/31/2020  HPI/Events of Note  Nursing confusion over orders for LR and D5 LR IV infusions.   eICU Interventions  PlanL D/C LR IV infusion.      Intervention Category Major Interventions: Other:  Bashir Marchetti Dennard Nip 12/31/2020, 3:50 AM

## 2020-12-31 NOTE — Progress Notes (Addendum)
Pts mother called for an update & requests that the only visitors allowed to see this pt or receive information on this pt are: herself Lamount Cohen), Jenny Reichmann & JAARS

## 2020-12-31 NOTE — Progress Notes (Signed)
Patient with increased agitation after 2 mg ativan administered. CCM to bedside- RN to increase precedex to 1.2 per verbal order from Gleason, PA. Patient resting comfortably at this time.
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
## 2020-12-31 NOTE — ED Notes (Signed)
Patient becomes combative whenever he wakes up. Report given to ICU nurse Torie.

## 2021-01-01 DIAGNOSIS — M6282 Rhabdomyolysis: Secondary | ICD-10-CM

## 2021-01-01 DIAGNOSIS — J13 Pneumonia due to Streptococcus pneumoniae: Secondary | ICD-10-CM

## 2021-01-01 DIAGNOSIS — F10231 Alcohol dependence with withdrawal delirium: Secondary | ICD-10-CM

## 2021-01-01 LAB — BASIC METABOLIC PANEL
Anion gap: 7 (ref 5–15)
BUN: 21 mg/dL — ABNORMAL HIGH (ref 6–20)
CO2: 26 mmol/L (ref 22–32)
Calcium: 8.3 mg/dL — ABNORMAL LOW (ref 8.9–10.3)
Chloride: 104 mmol/L (ref 98–111)
Creatinine, Ser: 1.1 mg/dL (ref 0.61–1.24)
GFR, Estimated: >60 mL/min (ref >60)
Glucose, Bld: 95 mg/dL (ref 70–99)
Potassium: 4.5 mmol/L (ref 3.5–5.1)
Sodium: 137 mmol/L (ref 135–145)

## 2021-01-01 LAB — CK: Total CK: 861 U/L — ABNORMAL HIGH (ref 49–397)

## 2021-01-01 LAB — URINE CULTURE: Culture: NO GROWTH

## 2021-01-01 LAB — GLUCOSE, CAPILLARY
Glucose-Capillary: 86 mg/dL (ref 70–99)
Glucose-Capillary: 82 mg/dL (ref 70–99)
Glucose-Capillary: 91 mg/dL (ref 70–99)
Glucose-Capillary: 96 mg/dL (ref 70–99)
Glucose-Capillary: 86 mg/dL (ref 70–99)
Glucose-Capillary: 72 mg/dL (ref 70–99)

## 2021-01-01 LAB — MAGNESIUM: Magnesium: 2.1 mg/dL (ref 1.7–2.4)

## 2021-01-01 LAB — HEMOGLOBIN A1C
Hgb A1c MFr Bld: 5.4 % (ref 4.8–5.6)
Mean Plasma Glucose: 108 mg/dL

## 2021-01-01 LAB — LEGIONELLA PNEUMOPHILA SEROGP 1 UR AG: L. pneumophila Serogp 1 Ur Ag: NEGATIVE

## 2021-01-01 LAB — CBC
HCT: 41.6 % (ref 39.0–52.0)
Hemoglobin: 13.2 g/dL (ref 13.0–17.0)
MCH: 27.2 pg (ref 26.0–34.0)
MCHC: 31.7 g/dL (ref 30.0–36.0)
MCV: 85.8 fL (ref 80.0–100.0)
Platelets: 173 10*3/uL (ref 150–400)
RBC: 4.85 MIL/uL (ref 4.22–5.81)
RDW: 13.2 % (ref 11.5–15.5)
WBC: 13.4 10*3/uL — ABNORMAL HIGH (ref 4.0–10.5)
nRBC: 0 % (ref 0.0–0.2)

## 2021-01-01 NOTE — Progress Notes (Signed)
Called E-link reported that patient's CBG dropping every Q4, now at 72. Ogan MD ordered to replace the IVF to D5LR at 58ml/hr.
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
## 2021-01-01 NOTE — Progress Notes (Signed)
Mother at bedside. Requested only herself(Francis Walters), Francis Walters, Francis Walters and Francis Walters be allowed to visit and be provided with information for the duration of the stay. RN communicated this information to both unit secretary and front desk.

## 2021-01-02 DIAGNOSIS — J189 Pneumonia, unspecified organism: Secondary | ICD-10-CM

## 2021-01-02 LAB — CBC
HCT: 40.7 % (ref 39.0–52.0)
Hemoglobin: 13.4 g/dL (ref 13.0–17.0)
MCH: 27.3 pg (ref 26.0–34.0)
MCHC: 32.9 g/dL (ref 30.0–36.0)
MCV: 83.1 fL (ref 80.0–100.0)
Platelets: 174 10*3/uL (ref 150–400)
RBC: 4.9 MIL/uL (ref 4.22–5.81)
RDW: 12.5 % (ref 11.5–15.5)
WBC: 10.7 10*3/uL — ABNORMAL HIGH (ref 4.0–10.5)
nRBC: 0 % (ref 0.0–0.2)

## 2021-01-02 LAB — GLUCOSE, CAPILLARY
Glucose-Capillary: 73 mg/dL (ref 70–99)
Glucose-Capillary: 105 mg/dL — ABNORMAL HIGH (ref 70–99)
Glucose-Capillary: 101 mg/dL — ABNORMAL HIGH (ref 70–99)
Glucose-Capillary: 112 mg/dL — ABNORMAL HIGH (ref 70–99)
Glucose-Capillary: 109 mg/dL — ABNORMAL HIGH (ref 70–99)
Glucose-Capillary: 88 mg/dL (ref 70–99)

## 2021-01-02 LAB — COMPREHENSIVE METABOLIC PANEL
ALT: 244 U/L — ABNORMAL HIGH (ref 0–44)
AST: 186 U/L — ABNORMAL HIGH (ref 15–41)
Albumin: 3 g/dL — ABNORMAL LOW (ref 3.5–5.0)
Alkaline Phosphatase: 41 U/L (ref 38–126)
Anion gap: 5 (ref 5–15)
BUN: 14 mg/dL (ref 6–20)
CO2: 30 mmol/L (ref 22–32)
Calcium: 8.8 mg/dL — ABNORMAL LOW (ref 8.9–10.3)
Chloride: 106 mmol/L (ref 98–111)
Creatinine, Ser: 0.8 mg/dL (ref 0.61–1.24)
GFR, Estimated: >60 mL/min (ref >60)
Glucose, Bld: 85 mg/dL (ref 70–99)
Potassium: 3.9 mmol/L (ref 3.5–5.1)
Sodium: 141 mmol/L (ref 135–145)
Total Bilirubin: 1.3 mg/dL — ABNORMAL HIGH (ref 0.3–1.2)
Total Protein: 6.1 g/dL — ABNORMAL LOW (ref 6.5–8.1)

## 2021-01-02 LAB — HEPATITIS PANEL, ACUTE
HCV Ab: REACTIVE — AB
Hep A IgM: NONREACTIVE
Hep B C IgM: NONREACTIVE
Hepatitis B Surface Ag: NONREACTIVE

## 2021-01-02 MED ORDER — DEXTROSE IN LACTATED RINGERS 5 % IV SOLN: INTRAVENOUS | Status: DC

## 2021-01-02 MED ORDER — CHLORHEXIDINE GLUCONATE 0.12 % MT SOLN
15.0000 mL | Freq: Two times a day (BID) | OROMUCOSAL | Status: DC
  Administered 2021-01-02 – 2021-01-09 (×10): 15 mL via OROMUCOSAL
  Filled 2021-01-02 (×9): qty 15

## 2021-01-02 MED ORDER — ORAL CARE MOUTH RINSE
15.0000 mL | Freq: Two times a day (BID) | OROMUCOSAL | Status: DC
  Administered 2021-01-03 – 2021-01-08 (×11): 15 mL via OROMUCOSAL

## 2021-01-02 NOTE — Progress Notes (Signed)
Titrating precedex down as instructed by Dr.Clark. Patient tolerating well at this time. Per Dr.Clark, continue to decrease Precedex as able and do not increase dose. Instead use PRN Ativan for agitation.

## 2021-01-02 NOTE — TOC Progression Note (Signed)
Transition of Care [facility]) - Progression Note    Patient Details  Name: Francis Walters MRN: [id number] Date of Birth: 28-May-1991  [facility]) CM/SW Contact  Golda Acre, RN Phone Number: 01/02/2021, 8:02 AM  Clinical Narrative:    Assessment and plan: Acute hypoxic respiratory failure due to pneumococcal pneumonia  -Supplemental oxygen as required to maintain SPO2 greater than 90%. - Continue ceftriaxone.  Planning for 7-day course.       Sepsis due to strep pneumo pneumonia-hyperbilirubinemia with elevated transaminases, AKI, respiratory failure due to sepsis. - Continue supportive care, antibiotics. - Has not been requiring vasopressors     Aggressive behavior due to presumed alcohol withdrawal.  Polysubstance abuser - Continue Precedex infusion.  Hopefully can transition off of this to scheduled antipsychotics and benzos tomorrow as his respiratory failure continues to improve. - Continue Ativan and Haldol as needed - Continues to require restraints for protection of staff.     Mild rhabdo, improving - No additional trending of CK required. - Continue to monitor renal function.  Polysubstance abuse - Will counsel on the importance of cessation when appropriate.     AKI resolved - Continue to monitor renal function and I/os  TOC PLAN OF CARE:  Following for progression of care and toc needs.  Expected Discharge Plan: Home/Self Care Barriers to Discharge: Continued Medical Work up  Expected Discharge Plan and Services Expected Discharge Plan: Home/Self Care   Discharge Planning Services: CM Consult   Living arrangements for the past 2 months: Single Family Home                                       Social Determinants of Health (SDOH) Interventions    Readmission Risk Interventions No flowsheet data found.
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
## 2021-01-02 NOTE — Progress Notes (Signed)
eLink Physician-Brief Progress Note Patient Name: Francis Walters DOB: 11-18-1990 MRN: [id number]   Date of Service  01/02/2021  HPI/Events of Note  Patient remains somnolent and NPO.  eICU Interventions  D 5 % LR gtt extended for another 24 hours.        Migdalia Dk 01/02/2021, 9:34 PM

## 2021-01-02 NOTE — Progress Notes (Signed)
eLink Physician-Brief Progress Note Patient Name: Francis Walters DOB: 13-Oct-1990 MRN: [id number]   Date of Service  01/02/2021  HPI/Events of Note  Patient is NPO and needs switch from LR to calorie containing iv fluids.  eICU Interventions  D 5 LR started at 75 ml / hour. LR discontinued.        Thomasene Lot Acy Orsak 01/02/2021, 12:08 AM

## 2021-01-03 ENCOUNTER — Inpatient Hospital Stay (HOSPITAL_COMMUNITY): Payer: Self-pay

## 2021-01-03 LAB — GLUCOSE, CAPILLARY
Glucose-Capillary: 100 mg/dL — ABNORMAL HIGH (ref 70–99)
Glucose-Capillary: 99 mg/dL (ref 70–99)
Glucose-Capillary: 91 mg/dL (ref 70–99)
Glucose-Capillary: 73 mg/dL (ref 70–99)
Glucose-Capillary: 75 mg/dL (ref 70–99)
Glucose-Capillary: 85 mg/dL (ref 70–99)

## 2021-01-03 LAB — AMMONIA
Ammonia: 74 umol/L — ABNORMAL HIGH (ref 9–35)
Ammonia: 14 umol/L (ref 9–35)

## 2021-01-03 MED ORDER — LACTULOSE 10 GM/15ML PO SOLN
30.0000 g | Freq: Every day | ORAL | Status: DC
  Administered 2021-01-07 – 2021-01-09 (×3): 30 g via ORAL
  Filled 2021-01-03 (×3): qty 45

## 2021-01-03 MED ORDER — CLONIDINE HCL 0.3 MG/24HR TD PTWK
0.3000 mg | MEDICATED_PATCH | TRANSDERMAL | Status: DC
  Filled 2021-01-03: qty 1

## 2021-01-03 MED ORDER — SODIUM CHLORIDE 0.9 % IV SOLN
2.0000 g | INTRAVENOUS | Status: AC
  Administered 2021-01-03 – 2021-01-08 (×6): 2 g via INTRAVENOUS
  Filled 2021-01-03 (×2): qty 20
  Filled 2021-01-03: qty 2
  Filled 2021-01-03 (×3): qty 20

## 2021-01-03 MED ORDER — LORAZEPAM 2 MG/ML IJ SOLN
0.5000 mg | INTRAMUSCULAR | Status: DC | PRN
  Administered 2021-01-03: 2 mg via INTRAVENOUS
  Administered 2021-01-03: 1 mg via INTRAVENOUS
  Administered 2021-01-04 – 2021-01-08 (×13): 2 mg via INTRAVENOUS
  Filled 2021-01-03 (×16): qty 1

## 2021-01-03 MED ORDER — ACYCLOVIR 5 % EX CREA
TOPICAL_CREAM | CUTANEOUS | Status: DC
  Filled 2021-01-03: qty 5

## 2021-01-03 MED ORDER — LACTULOSE ENEMA
300.0000 mL | Freq: Every day | ORAL | Status: DC
  Filled 2021-01-03: qty 300

## 2021-01-03 MED ORDER — HALOPERIDOL LACTATE 5 MG/ML IJ SOLN
5.0000 mg | Freq: Four times a day (QID) | INTRAMUSCULAR | Status: DC | PRN
Start: 2021-01-03 — End: 2021-01-04
  Administered 2021-01-04: 5 mg via INTRAMUSCULAR
  Filled 2021-01-03: qty 1

## 2021-01-03 MED ORDER — LACTULOSE 10 GM/15ML PO SOLN
30.0000 g | Freq: Two times a day (BID) | ORAL | Status: DC
  Administered 2021-01-03: 30 g via ORAL
  Filled 2021-01-03: qty 45

## 2021-01-03 MED ORDER — CLONIDINE HCL 0.2 MG/24HR TD PTWK
0.2000 mg | MEDICATED_PATCH | TRANSDERMAL | Status: DC
  Filled 2021-01-03: qty 1

## 2021-01-03 MED ORDER — ACYCLOVIR 5 % EX OINT
TOPICAL_OINTMENT | CUTANEOUS | Status: AC
  Administered 2021-01-03 – 2021-01-05 (×3): 1 via TOPICAL
  Filled 2021-01-03: qty 15

## 2021-01-03 NOTE — Progress Notes (Addendum)
01/03/2021 Attending Addendum I have seen and evaluated the patient for metabolic encephalopathy, multifactorial  S:  Remains too sedate, precedex is being weaned.  O: Blood pressure (!) 146/94, pulse 69, temperature 98.7 F (37.1 C), temperature source Axillary, resp. rate (!) 29, height 5\' 10"  (1.778 m), weight 80.1 kg, SpO2 98 %.  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; ammonia is rising so I guess could be HE from acute liver injury Acute liver injury, HepC positive, stable Rhabdo improved  P:  - Wean precedex, hopefully he can start taking PO - Continue IV hydration - If can take PO, then can switch to clonidine taper as well as seroquel and lactulose - Psych consult once more awake - Check RUQ  Patient critically ill due to encephalopthy Interventions to address this today precedex titration Risk of deterioration without these interventions is high  I personally spent 34 minutes providing critical care not including any separately billable procedures  [place] MD [facility] Prefer epic messenger for cross cover needs If after hours, please call E-link      NAME:  Francis Walters, MRN:  Francis Walters, DOB:  1990-12-04, LOS: 4 ADMISSION DATE:  12/30/2020, CONSULTATION DATE:  12/30/20 REFERRING MD:  Dr 01/01/21, CHIEF COMPLAINT:  Acute metabolic encephalopathy   History of Present Illness: -patient also with MRN   29yo male found down with strong history of polysubstance abuse. Seen with acute hypoxia due to strep pneumoniae pneumonia RLL and rhabdo. PCCM consulted for further management and admission   Pertinent  Medical History  Polysubstance abuse  Significant Hospital  Events: Including procedures, antibiotic start and stop dates in addition to other pertinent events   6/27 admitted  6/29 continues on dexmed, ativan. Got some haldol additionally  6/30 still on dexmed in ICU  7/1 agitation improved, Precedex continues to be weaned   Interim History / Subjective:  No acute events overnight  Objective   Blood pressure (!) 146/87, pulse 78, temperature (!) 100.7 F (38.2 C), temperature source Axillary, resp. rate (!) 30, height 5\' 10"  (1.778 m), weight 80.1 kg, SpO2 96 %.        Intake/Output Summary (Last 24 hours) at 01/03/2021 0708 Last data filed at 01/03/2021 0500 Gross per 24 hour  Intake 1850.04 ml  Output 2675 ml  Net -824.96 ml    Filed Weights   01/01/21 0500 01/02/21 0500 01/03/21 0500  Weight: 72.5 kg 79.9 kg 80.1 kg   Physical Exam General: [place] male lying in bed in NAD   HEENT: [place]/AT, MM pink/moist, PERRL,  Neuro: Alert and oriented, non-focal  CV: s1s2 regular rate and rhythm, no murmur, rubs, or gallops,  PULM:  Clear to ascultation bilaterally, no increased work of breathing, no added breath sounds GI: soft, bowel sounds active in all 4 quadrants, non-tender, non-distended Extremities: warm/dry, no edema  Skin: no rashes or lesions  Resolved Hospital Problem list   AKI  Acute hypoxic respiratory failure due to  Sepsis  Assessment & Plan:  Strep pneumo PNA of RLL  -Urine strep pneumo positive P: Remains on Ceftriaxone, will establish stop date today  Head of bed elevated 30 degrees. Ensure adequate pulmonary hygiene   Acute toxic metabolic encephalopathy  -Likely secondary  to substance use, urine drug screen positive for amphetamine, benzos, THC -CT head negative Polysubstance abuse: cocaine, heroin, methamphetamine, THC, tobacco  P: Maintain neuro protective measures Nutrition and bowel regiment  Seizure precautions  Aspirations precautions  Continue to wean Precedex, goal of off today  Supplement Thiamine,  multivitamin, and folic acid  Would benefit from psych consult   Hx aggressive behavioral disturbances  -reported homicidal behavior P: Consider psych consult when mentation allows   Transaminitis, mild  Hepatitis C -Likely secondary to shock liver -Tylenol level normal P: Avoid hepatotoxins  Recheck ammonia    Best Practice    Diet/type: NPO DVT prophylaxis: prophylactic heparin  GI prophylaxis: PPI Lines: N/A Foley:  N/A Code Status:  full code Last date of multidisciplinary goals of care discussion [Jeanie Haman [phone] -tried to call, number disconnected  CRITICAL CARE Performed by: Whitney D. Harris  Total critical care time: 34 minutes  Critical care time was exclusive of separately billable procedures and treating other patients. Critical care was necessary to treat or prevent imminent or life-threatening deterioration.  Critical care was time spent personally by me on the following activities: development of treatment plan with patient and/or surrogate as well as nursing, discussions with consultants, evaluation of patient's response to treatment, examination of patient, obtaining history from patient or surrogate, ordering and performing treatments and interventions, ordering and review of laboratory studies, ordering and review of radiographic studies, pulse oximetry and re-evaluation of patient's condition.  Whitney D. Tiburcio Pea, NP-C [facility] Personal contact information can be found on Amion  01/03/2021, 8:02 AM

## 2021-01-04 ENCOUNTER — Inpatient Hospital Stay (HOSPITAL_COMMUNITY): Payer: Self-pay

## 2021-01-04 DIAGNOSIS — J189 Pneumonia, unspecified organism: Secondary | ICD-10-CM | POA: Diagnosis not present

## 2021-01-04 DIAGNOSIS — T17908A Unspecified foreign body in respiratory tract, part unspecified causing other injury, initial encounter: Secondary | ICD-10-CM

## 2021-01-04 DIAGNOSIS — R0902 Hypoxemia: Secondary | ICD-10-CM

## 2021-01-04 DIAGNOSIS — J154 Pneumonia due to other streptococci: Secondary | ICD-10-CM | POA: Diagnosis present

## 2021-01-04 LAB — COMPREHENSIVE METABOLIC PANEL
ALT: 150 U/L — ABNORMAL HIGH (ref 0–44)
AST: 87 U/L — ABNORMAL HIGH (ref 15–41)
Albumin: 3.5 g/dL (ref 3.5–5.0)
Alkaline Phosphatase: 48 U/L (ref 38–126)
Anion gap: 8 (ref 5–15)
BUN: 7 mg/dL (ref 6–20)
CO2: 26 mmol/L (ref 22–32)
Calcium: 8.9 mg/dL (ref 8.9–10.3)
Chloride: 105 mmol/L (ref 98–111)
Creatinine, Ser: 0.83 mg/dL (ref 0.61–1.24)
GFR, Estimated: >60 mL/min (ref >60)
Glucose, Bld: 87 mg/dL (ref 70–99)
Potassium: 3.4 mmol/L — ABNORMAL LOW (ref 3.5–5.1)
Sodium: 139 mmol/L (ref 135–145)
Total Bilirubin: 0.9 mg/dL (ref 0.3–1.2)
Total Protein: 7.2 g/dL (ref 6.5–8.1)

## 2021-01-04 LAB — GLUCOSE, CAPILLARY
Glucose-Capillary: 95 mg/dL (ref 70–99)
Glucose-Capillary: 93 mg/dL (ref 70–99)
Glucose-Capillary: 100 mg/dL — ABNORMAL HIGH (ref 70–99)
Glucose-Capillary: 101 mg/dL — ABNORMAL HIGH (ref 70–99)
Glucose-Capillary: 98 mg/dL (ref 70–99)
Glucose-Capillary: 81 mg/dL (ref 70–99)

## 2021-01-04 LAB — BLOOD GAS, ARTERIAL
Acid-Base Excess: 1.4 mmol/L (ref 0.0–2.0)
Bicarbonate: 25.1 mmol/L (ref 20.0–28.0)
FIO2: 68
O2 Saturation: 97.6 %
Patient temperature: 100.8
pCO2 arterial: 40.9 mmHg (ref 32.0–48.0)
pH, Arterial: 7.411 (ref 7.350–7.450)
pO2, Arterial: 101 mmHg (ref 83.0–108.0)

## 2021-01-04 LAB — CULTURE, BLOOD (ROUTINE X 2)
Culture: NO GROWTH
Special Requests: ADEQUATE

## 2021-01-04 MED ORDER — METRONIDAZOLE 500 MG/100ML IV SOLN
500.0000 mg | Freq: Three times a day (TID) | INTRAVENOUS | Status: AC
  Administered 2021-01-04 – 2021-01-08 (×15): 500 mg via INTRAVENOUS
  Filled 2021-01-04 (×15): qty 100

## 2021-01-04 MED ORDER — DEXTROSE-NACL 5-0.9 % IV SOLN: INTRAVENOUS | Status: DC

## 2021-01-04 MED ORDER — ONDANSETRON HCL 4 MG/2ML IJ SOLN
4.0000 mg | Freq: Four times a day (QID) | INTRAMUSCULAR | Status: DC | PRN
  Administered 2021-01-04 – 2021-01-07 (×3): 4 mg via INTRAVENOUS
  Filled 2021-01-04 (×3): qty 2

## 2021-01-04 MED ORDER — POTASSIUM CHLORIDE 10 MEQ/100ML IV SOLN
10.0000 meq | INTRAVENOUS | Status: AC
  Administered 2021-01-04 (×4): 10 meq via INTRAVENOUS
  Filled 2021-01-04 (×4): qty 100

## 2021-01-04 MED ORDER — DEXMEDETOMIDINE HCL IN NACL 200 MCG/50ML IV SOLN
0.2000 ug/kg/h | INTRAVENOUS | Status: DC
  Administered 2021-01-04 (×2): 0.5 ug/kg/h via INTRAVENOUS
  Administered 2021-01-04: 0.4 ug/kg/h via INTRAVENOUS
  Administered 2021-01-04: 0.2 ug/kg/h via INTRAVENOUS
  Filled 2021-01-04 (×6): qty 50

## 2021-01-04 NOTE — Progress Notes (Signed)
eLink Physician-Brief Progress Note Patient Name: Francis Walters DOB: [birth year] MRN: [id number]   Date of Service  01/04/2021  HPI/Events of Note  Episodes of vomiting - QTc interval = 0.34 seconds.   eICU Interventions  Plan: Zofran 4 mg Q 6 hours PRN N/V.     Intervention Category Major Interventions: Other:  Francis Walters 01/04/2021, 12:09 AM

## 2021-01-04 NOTE — Progress Notes (Signed)
NAME:  Francis Walters, MRN:  [id number], DOB:  October 24, 1990, LOS: 5 ADMISSION DATE:  12/30/2020, CONSULTATION DATE:  12/30/20 REFERRING MD:  Dr Carmell Austria, CHIEF COMPLAINT:  Acute metabolic encephalopathy   History of Present Illness: -patient also with MRN   30yo male found down with strong history of polysubstance abuse. Seen with acute hypoxia due to strep pneumoniae pneumonia RLL and rhabdo. PCCM consulted for further management and admission   Pertinent  Medical History  Polysubstance abuse  Significant Hospital Events: Including procedures, antibiotic start and stop dates in addition to other pertinent events   6/27 admitted with UDS pos amphetamines/ THC / Benzo's 6/29 continues on dexmed, ativan. Got some haldol additionally  6/30 still on dexmed in ICU  7/1 agitation improved, Precedex continues to be weaned  7/2 am vomited ? Asp added flagy   Scheduled Meds:  acyclovir ointment   Topical Q3H   chlorhexidine  15 mL Mouth Rinse BID   Chlorhexidine Gluconate Cloth  6 each Topical Daily   folic acid  1 mg Intravenous Daily   heparin  5,000 Units Subcutaneous Q8H   lactulose  30 g Oral Daily   mouth rinse  15 mL Mouth Rinse q12n4p   mupirocin ointment  1 application Nasal BID   pantoprazole (PROTONIX) IV  40 mg Intravenous QHS   thiamine injection  100 mg Intravenous Daily   Continuous Infusions:  sodium chloride Stopped (12/31/20 1150)   cefTRIAXone (ROCEPHIN)  IV Stopped (01/03/21 1019)   dexmedetomidine (PRECEDEX) IV infusion 0.4 mcg/kg/hr (01/04/21 0600)   dextrose 5 % and 0.9% NaCl Stopped (01/04/21 0508)   metronidazole Stopped (01/04/21 0334)   potassium chloride 10 mEq (01/04/21 0616)   PRN Meds:.docusate sodium, haloperidol lactate, LORazepam, ondansetron (ZOFRAN) IV, polyethylene glycol     Interim History / Subjective:  Vomiting ? Asp per E link> fllagyl added/ agitated Back on precedex at 0.5 this am but weaning to RA   Objective   Blood  pressure (!) 130/58, pulse (!) 104, temperature (!) 101 F (38.3 C), temperature source Oral, resp. rate (!) 23, height 5\' 10"  (1.778 m), weight 80.1 kg, SpO2 99 %.        Intake/Output Summary (Last 24 hours) at 01/04/2021 0642 Last data filed at 01/04/2021 0600 Gross per 24 hour  Intake 2109.58 ml  Output 1875 ml  Net 234.58 ml   Filed Weights   01/01/21 0500 01/02/21 0500 01/03/21 0500  Weight: 72.5 kg 79.9 kg 80.1 kg   Physical Exam  Tmax  101 / ice packs applied  General appearance:    minimal response to verbal, some eye contact  At Rest 02 sats  100% on 6lpm  > weaing off  No jvd Oropharynx clear,  mucosa nl Neck supple Lungs with a few scattered exp > insp rhonchi bilaterally RRR no s3 or or sign murmur Abd soft/ nl  excursion  Extr warm with no edema or clubbing noted Neuro  Sensorium sedated/ moving all 4 spont,  turns head and makes eye contact but not f/c consistently    I personally reviewed images and agree with radiology impression as follows:  CXR:   portable 7/2 1. Improved aeration of the right lung base. 2. New left basilar opacities, concerning for aspiration or infection.  Resolved Hospital Problem list   AKI  Acute hypoxic respiratory failure due to  Sepsis  Assessment & Plan:  Strep pneumo PNA of RLL  -Urine strep pneumo positive P:  Continuing rocephin Head of bed elevated 30 degrees. Ensure adequate pulmonary hygiene   Acute toxic metabolic encephalopathy  -Likely secondary to substance use, urine drug screen positive for amphetamine, benzos, THC -CT head negative  Polysubstance abuse: cocaine, heroin, methamphetamine, THC, tobacco  P: Maintain neuro protective measures Nutrition and bowel regiment  Seizure precautions  Aspirations precautions  Titrate precedex to minimal need to control agitation  Continue to supplement Thiamine, multivitamin, and folic acid  Would benefit from psych consult eventually but not likely to help at this  juncture  Hx aggressive behavioral disturbances  -reported homicidal behavior P: Consider psych consult when mentation allows   Transaminitis, mild  Hepatitis C -Likely secondary to shock liver -Tylenol level normal Recheck ammonia = 14  01/03/21  P: Avoid hepatotoxins     New HCAP  likely asp 01/04/21  >> added flagyl 7/2 >> NPO  Best Practice    Diet/type: NPO DVT prophylaxis: prophylactic heparin  GI prophylaxis: PPI Lines: N/A Foley:  N/A Code Status:  full code Last date of multidisciplinary goals of care discussion Lamount Cohen [phone] -tried to call, number disconnected    The patient is critically ill with multiple organ systems failure and requires high complexity decision making for assessment and support, frequent evaluation and titration of therapies, application of advanced monitoring technologies and extensive interpretation of multiple databases. Critical Care Time devoted to patient care services described in this note is 35 minutes.   Sandrea Hughs, MD Pulmonary and Critical Care Medicine  Healthcare Cell [phone]   After 7:00 pm call Elink  [phone]

## 2021-01-04 NOTE — Progress Notes (Signed)
eLink Physician-Brief Progress Note Patient Name: Francis Walters DOB: 08-23-1990 MRN: [id number]   Date of Service  01/04/2021  HPI/Events of Note  N/V   eICU Interventions  Plan: NPO. D5 0.9 NaCl to run IV at 75 mL/hour.      Intervention Category Major Interventions: Other:  Elleanor Guyett Dennard Nip 01/04/2021, 2:41 AM

## 2021-01-04 NOTE — Progress Notes (Signed)
Patient's Temp is 101 F orally. Elink and Dr. Arsenio Loader are notified. 4 Ice packs were applied. Will continue to assess.
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
## 2021-01-04 NOTE — Progress Notes (Signed)
Mother at the bedside for a couple of hour. Updated. However, when explained not to stimulate patient because of his frequent irritation/agitation, she continuously trying to wake him up. State "I just teasing him, I am asking if he wants some coffee, or some food". I reminded his about his NPO, (which she states she knows) and it seems not very nice to tease and irritate confused and already agitated person, knowing  a person cannot take anything by mouth. She stated "I know, I just want him to be ok. Also, she is asking about his STD testing. Provider notified and will return to mother shortly.  Pt responsive to voice, disoriented, cannot follow commands (or does not want. Not sure). After removing his Edgar sustains his O2 wnl. VS stable.Unable to decrease his Precedex yet, additionally given Ativan PRN for agitation.  Bed in low position, alarms are on, call bell in reach, floor matts are in place. Continue to monitor

## 2021-01-04 NOTE — Progress Notes (Signed)
eLink Physician-Brief Progress Note Patient Name: Francis Walters DOB: April 05, 1991 MRN: [id number]   Date of Service  01/04/2021  HPI/Events of Note  Fever to 101.0 F - Patient was already on Ceftriaxone to strep CAP. Likely aspiration. Flagyl added. Not able to use Tylenol as AST and ALT both elevated.   eICU Interventions  Plan: Ice packs PRN. Defer need for further blood cultures to PCCM day rounding team.      Intervention Category Major Interventions: Infection - evaluation and management  Serra Younan Eugene 01/04/2021, 6:01 AM

## 2021-01-04 NOTE — Progress Notes (Signed)
[facility] ADULT ICU REPLACEMENT PROTOCOL   The patient does apply for the [facility] Adult ICU Electrolyte Replacment Protocol based on the criteria listed below:   1. Is GFR >/= 30 ml/min? Yes.    Patient's GFR today is >60 2. Is SCr </= 2? No. Patient's SCr is 0.83 ml/kg/hr 3. Did SCr increase >/= 0.5 in 24 hours? No. 4. Abnormal electrolyte(s): k+ 3.4 5. Ordered repletion with: standing orders 6. If a panic level lab has been reported, has the CCM MD in charge been notified? No..   Physician:    Melvern Banker 01/04/2021 4:52 AM
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
## 2021-01-04 NOTE — Progress Notes (Signed)
eLink Physician-Brief Progress Note Patient Name: Francis Walters DOB: March 30, 1991 MRN: [id number]   Date of Service  01/04/2021  HPI/Events of Note  Review of CXR reveals: Improved aeration of the right lung base.  New left basilar opacities, concerning for aspiration or infection. The patient is currently on Ceftriaxone for Strep CAP. Hx of PCN allergy.   eICU Interventions  Plan: Continue Ceftriaxone. Add Flagyl for anaerobic coverage.      Intervention Category Major Interventions: Other:;Infection - evaluation and management  Jed Kutch Dennard Nip 01/04/2021, 1:29 AM

## 2021-01-04 NOTE — Progress Notes (Addendum)
eLink Physician-Brief Progress Note Patient Name: Francis Walters DOB: [birth year] MRN: [id number]   Date of Service  01/04/2021  HPI/Events of Note  Amxiety - Episode of N/V and question  of  aspiration. Now  with increased RR = 34-41 and HR = 140 sinus tachycardia. He is very hoarse. He acts as if he has VCD.  eICU Interventions  Plan: Portable CXR STAT. Low dose Precedex IV infusion (0.1-06 mc/kg/hour. Titrate to RASS = 0. PCCM ground team asked to assess the patient at bedside. However, they have 3 new admissions at [facility] and will not be able to see patient expediently.      Intervention Category Major Interventions: Delirium, psychosis, severe agitation - evaluation and management;Other:  Lenell Antu 01/04/2021, 12:34 AM

## 2021-01-05 LAB — GLUCOSE, CAPILLARY
Glucose-Capillary: 102 mg/dL — ABNORMAL HIGH (ref 70–99)
Glucose-Capillary: 89 mg/dL (ref 70–99)
Glucose-Capillary: 93 mg/dL (ref 70–99)
Glucose-Capillary: 81 mg/dL (ref 70–99)
Glucose-Capillary: 84 mg/dL (ref 70–99)
Glucose-Capillary: 94 mg/dL (ref 70–99)

## 2021-01-05 LAB — CULTURE, BLOOD (ROUTINE X 2)
Culture: NO GROWTH
Culture: NO GROWTH
Special Requests: ADEQUATE
Special Requests: ADEQUATE

## 2021-01-05 MED ORDER — DIPHENHYDRAMINE HCL 50 MG/ML IJ SOLN
25.0000 mg | Freq: Once | INTRAMUSCULAR | Status: AC
  Administered 2021-01-05: 25 mg via INTRAVENOUS
  Filled 2021-01-05: qty 1

## 2021-01-05 MED ORDER — NICOTINE 21 MG/24HR TD PT24
21.0000 mg | MEDICATED_PATCH | Freq: Every day | TRANSDERMAL | Status: DC
  Administered 2021-01-05 – 2021-01-09 (×5): 21 mg via TRANSDERMAL
  Filled 2021-01-05 (×5): qty 1

## 2021-01-05 MED ORDER — CLONIDINE HCL 0.1 MG PO TABS
0.1000 mg | ORAL_TABLET | Freq: Three times a day (TID) | ORAL | Status: DC
  Administered 2021-01-06 – 2021-01-09 (×9): 0.1 mg via ORAL
  Filled 2021-01-05 (×10): qty 1

## 2021-01-05 NOTE — Progress Notes (Signed)
NAME:  Francis Walters, MRN:  [id number], DOB:  03/20/1991, LOS: 6 ADMISSION DATE:  12/30/2020, CONSULTATION DATE:  12/30/20 REFERRING MD:  Dr Carmell Austria, CHIEF COMPLAINT:  Acute metabolic encephalopathy   History of Present Illness: -patient also with MRN   30yo male found down with strong history of polysubstance abuse. Seen with acute hypoxia due to strep pneumoniae pneumonia RLL and rhabdo. PCCM consulted for further management and admission   Pertinent  Medical History  Polysubstance abuse  Significant Hospital Events: Including procedures, antibiotic start and stop dates in addition to other pertinent events   6/27 admitted with UDS pos amphetamines/ THC / Benzo's 6/29 continues on dexmed, ativan. Got some haldol additionally  6/30 still on dexmed in ICU  7/1 agitation improved, Precedex continues to be weaned  7/2 am vomited ? Asp added flagy   Scheduled Meds:  acyclovir ointment   Topical Q3H   chlorhexidine  15 mL Mouth Rinse BID   Chlorhexidine Gluconate Cloth  6 each Topical Daily   folic acid  1 mg Intravenous Daily   heparin  5,000 Units Subcutaneous Q8H   lactulose  30 g Oral Daily   mouth rinse  15 mL Mouth Rinse q12n4p   pantoprazole (PROTONIX) IV  40 mg Intravenous QHS   thiamine injection  100 mg Intravenous Daily   Continuous Infusions:  sodium chloride Stopped (01/04/21 1810)   cefTRIAXone (ROCEPHIN)  IV Stopped (01/05/21 0956)   dexmedetomidine (PRECEDEX) IV infusion 0.2 mcg/kg/hr (01/05/21 1007)   dextrose 5 % and 0.9% NaCl 75 mL/hr at 01/05/21 1007   metronidazole 500 mg (01/05/21 1007)   PRN Meds:.docusate sodium, LORazepam, ondansetron (ZOFRAN) IV, polyethylene glycol     Interim History / Subjective:   More alert, very hoarse, puny  voluntary cough   Objective   Blood pressure 134/75, pulse 68, temperature 98 F (36.7 C), temperature source Oral, resp. rate (!) 24, height 5\' 10"  (1.778 m), weight 78.3 kg, SpO2 94 %.         Intake/Output Summary (Last 24 hours) at 01/05/2021 1028 Last data filed at 01/05/2021 1007 Gross per 24 hour  Intake 2498.69 ml  Output 750 ml  Net 1748.69 ml   Filed Weights   01/02/21 0500 01/03/21 0500 01/05/21 0347  Weight: 79.9 kg 80.1 kg 78.3 kg   Physical Exam  Tmax  100.8 (down trend)  General appearance:   young wm nad  At Rest 02 sats  93% on RA  No jvd Oropharynx clear,  mucosa nl Neck supple Lungs with classic bronchial changes L base  RRR no s3 or or sign murmur Abd soft with nl  excursion  Extr warm with no edema or clubbing noted Neuro  Sensorium more alert ,  no apparent motor deficits still on low dose precedex   Resolved Hospital Problem list   AKI  Acute hypoxic respiratory failure due to  Sepsis  Assessment & Plan:  Strep pneumo PNA of RLL  -Urine strep pneumo positive P: Continuing rocephin Head of bed elevated 30 degrees. Ensure adequate pulmonary hygiene   Acute toxic metabolic encephalopathy  -Likely secondary to substance use, urine drug screen positive for amphetamine, benzos, THC -CT head negative >>> add clonidine am 7/3 and d/c precedex   Polysubstance abuse: cocaine, heroin, methamphetamine, THC, tobacco  P: Maintain neuro protective measures Nutrition and bowel regiment  Seizure precautions  Aspirations precautions  Continue to supplement Thiamine, multivitamin, and folic acid  Would benefit from psych  consult eventually but not likely to help at this juncture  Hx aggressive behavioral disturbances  -reported homicidal behavior P: Consider psych consult when mentation allows   Transaminitis, mild  Hepatitis C -Likely secondary to shock liver -Tylenol level normal Recheck ammonia = 14  01/03/21  P: Avoid hepatotoxins     New HCAP  likely asp 01/04/21  >> added flagyl 7/2 >> NPO  Best Practice    Diet/type:  try sips/chips/po meds  DVT prophylaxis: prophylactic heparin  GI prophylaxis: PPI Lines: N/A Foley:   N/A Code Status:  full code    Sandrea Hughs, MD Pulmonary and Critical Care Medicine [facility] Cell [phone]   After 7:00 pm call Elink  [phone]
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
## 2021-01-05 NOTE — Progress Notes (Signed)
eLink Physician-Brief Progress Note Patient Name: Francis Walters DOB: 1991-02-26 MRN: [id number]   Date of Service  01/05/2021  HPI/Events of Note  Patient requests sleep aid. Patient is NPO. He  chokes on sips of water.   eICU Interventions  Plan: Benadryl 25 mg IV X 1 now.        Keiera Strathman [place] 01/05/2021, 10:15 PM

## 2021-01-06 ENCOUNTER — Inpatient Hospital Stay (HOSPITAL_COMMUNITY): Payer: Self-pay

## 2021-01-06 DIAGNOSIS — F1510 Other stimulant abuse, uncomplicated: Secondary | ICD-10-CM

## 2021-01-06 LAB — GLUCOSE, CAPILLARY
Glucose-Capillary: 88 mg/dL (ref 70–99)
Glucose-Capillary: 108 mg/dL — ABNORMAL HIGH (ref 70–99)
Glucose-Capillary: 88 mg/dL (ref 70–99)
Glucose-Capillary: 120 mg/dL — ABNORMAL HIGH (ref 70–99)
Glucose-Capillary: 105 mg/dL — ABNORMAL HIGH (ref 70–99)
Glucose-Capillary: 111 mg/dL — ABNORMAL HIGH (ref 70–99)

## 2021-01-06 MED ORDER — HALOPERIDOL 1 MG PO TABS
1.0000 mg | ORAL_TABLET | Freq: Three times a day (TID) | ORAL | Status: DC | PRN
  Filled 2021-01-06: qty 1

## 2021-01-06 MED ORDER — HALOPERIDOL LACTATE 5 MG/ML IJ SOLN
1.0000 mg | Freq: Three times a day (TID) | INTRAMUSCULAR | Status: DC | PRN
  Administered 2021-01-06: 1 mg via INTRAMUSCULAR
  Filled 2021-01-06 (×2): qty 1

## 2021-01-06 NOTE — TOC Progression Note (Signed)
Transition of Care [facility]) - Progression Note    Patient Details  Name: Wilbur Oakland MRN: [id number] Date of Birth: Jan 02, 1991  Transition of [facility] CM/SW Contact  Golda Acre, RN Phone Number: 01/06/2021, 8:59 AM  Clinical Narrative:    Significant Hospital Events: Including procedures, antibiotic start and stop dates in addition to other pertinent events  6/27 admitted with UDS pos amphetamines/ THC / Benzo's 6/29 continues on dexmed, ativan. Got some haldol additionally 6/30 still on dexmed in ICU 7/1 agitation improved, Precedex continues to be weaned 7/2 am vomited ? Asp added flagy Tocplan of care: Pt threatening to leave ama due to not being allowed to go outside and smoke.  Pt is ivc's so therefore can not leave md has alerted staff and security.   Expected Discharge Plan: Home/Self Care Barriers to Discharge: Continued Medical Work up  Expected Discharge Plan and Services Expected Discharge Plan: Home/Self Care   Discharge Planning Services: CM Consult   Living arrangements for the past 2 months: Single Family Home                                       Social Determinants of Health (SDOH) Interventions    Readmission Risk Interventions No flowsheet data found.

## 2021-01-06 NOTE — Progress Notes (Signed)
NAME:  Francis Walters, MRN:  [id number], DOB:  [birth year], LOS: 7 ADMISSION DATE:  12/30/2020, CONSULTATION DATE:  12/30/20 REFERRING MD:  Dr Carmell Austria, CHIEF COMPLAINT:  Acute metabolic encephalopathy   History of Present Illness: -patient also with MRN   29yo male found down with strong history of polysubstance abuse. Seen with acute hypoxia due to strep pneumoniae pneumonia RLL and rhabdo. PCCM consulted for further management and admission   Pertinent  Medical History  Polysubstance abuse  Significant Hospital Events: Including procedures, antibiotic start and stop dates in addition to other pertinent events   6/27 admitted with UDS pos amphetamines/ THC / Benzo's 6/29 continues on dexmed, ativan. Got some haldol additionally  6/30 still on dexmed in ICU  7/1 agitation improved, Precedex continues to be weaned  7/2 am vomited ? Asp added flagy 7/3 d/c precedex/ pysch consulted for substance abuse and reported homicidal ideation.   Scheduled Meds:  acyclovir ointment   Topical Q3H   chlorhexidine  15 mL Mouth Rinse BID   Chlorhexidine Gluconate Cloth  6 each Topical Daily   cloNIDine  0.1 mg Oral TID   folic acid  1 mg Intravenous Daily   heparin  5,000 Units Subcutaneous Q8H   lactulose  30 g Oral Daily   mouth rinse  15 mL Mouth Rinse q12n4p   nicotine  21 mg Transdermal Daily   pantoprazole (PROTONIX) IV  40 mg Intravenous QHS   thiamine injection  100 mg Intravenous Daily   Continuous Infusions:  sodium chloride Stopped (01/04/21 1810)   cefTRIAXone (ROCEPHIN)  IV Stopped (01/05/21 0956)   dexmedetomidine (PRECEDEX) IV infusion Stopped (01/05/21 1040)   dextrose 5 % and 0.9% NaCl 75 mL/hr at 01/06/21 0400   metronidazole Stopped (01/06/21 0333)   PRN Meds:.docusate sodium, LORazepam, ondansetron (ZOFRAN) IV, polyethylene glycol     Interim History / Subjective:  Threatened to leave ama/ kept him against his will as expressed homicidal ideation ?  Effect of amphetamines which his is at risk of resuming - psych consulted now that off precedex.  Objective   Blood pressure (!) 143/83, pulse 83, temperature 99.2 F (37.3 C), temperature source Axillary, resp. rate (!) 28, height 5\' 10"  (1.778 m), weight 75.6 kg, SpO2 97 %.        Intake/Output Summary (Last 24 hours) at 01/06/2021 1027 Last data filed at 01/06/2021 0740 Gross per 24 hour  Intake 1553.98 ml  Output 1800 ml  Net -246.02 ml   Filed Weights   01/03/21 0500 01/05/21 0347 01/06/21 0500  Weight: 80.1 kg 78.3 kg 75.6 kg   Physical Exam  Tmax   98.5 (down) General appearance:    hoarse male, congested sounding cough s increased wob  At Rest 02 sats  95% on RA  No jvd Oropharynx clear,  mucosa nl Neck supple Lungs with junky inps/ exp rhonchi bilaterally RRR no s3 or or sign murmur Abd obese with nl excursion  Extr warm with no edema or clubbing noted Neuro  Sensorium variably confused per nursing,  no apparent motor deficits   Resolved Hospital Problem list   AKI  Acute hypoxic respiratory failure due to  Sepsis  Assessment & Plan:  Strep pneumo PNA of RLL  -Urine strep pneumo positive P: Continuing rocephin Head of bed elevated 30 degrees. Ensure adequate pulmonary hygiene     New HCAP  likely asp 01/04/21  >> added flagyl 7/2 >> NPO >>> recheck cxr 7/5  Acute toxic metabolic encephalopathy  -Likely secondary to substance use, urine drug screen positive for amphetamine, benzos, THC -CT head negative >>> add clonidine am 7/3 and d/c'd  precedex. Tolerating well so far  Polysubstance abuse: cocaine, heroin, methamphetamine, THC, tobacco  P: Maintain neuro protective measures Nutrition and bowel regiment  Seizure precautions  Aspirations precautions  Continue to supplement Thiamine, multivitamin, and folic acid    psych consult submitted 7/3, served papers to hold until cleared by psyche as he needs to stay anyway to treat pna and resuming  amphetamines may risk repeat homicidal ideation if hx is accurate.  Hx aggressive behavioral disturbances  -reported homicidal behavior P:  psych consulted  Transaminitis, mild  Hepatitis C -Likely secondary to shock liver -Tylenol level normal Recheck ammonia = 14  01/03/21  P: Avoid hepatotoxins     Best Practice    Diet/type:  NPO at ST rec as of 7/3 pm  DVT prophylaxis: prophylactic heparin  GI prophylaxis: PPI Lines: N/A Foley:  N/A Code Status:  full code    Sandrea Hughs, MD Pulmonary and Critical Care Medicine [facility] Cell [phone]   After 7:00 pm call Elink  [phone]

## 2021-01-06 NOTE — Progress Notes (Signed)
Modified Barium Swallow Progress Note  Patient Details  Name: Francis Walters MRN: [id number] Date of Birth: [birth year]  Today's Date: 01/06/2021  Modified Barium Swallow completed.  Full report located under Chart Review in the Imaging Section.  Brief recommendations include the following:  Clinical Impression  Patient presents with what appears to be a primarily sensory based dysphagia with suspected motor component as well. During oral phase, patient had mildly delayed transit with puree solids but liquids were all WFL at oral phase. Pharyngeal phase was marked by moderate aspiration before and during swallow due to swallow initiation delay to pyriform sinus and poor timing of laryngeal closure(PAS 7, 8). Patient did react with explosive cough however aspirate had already transited through vocal cords and down into trachea. Unable to see if patient able to transit any aspirate out of trachea as coughing was quite prolonged and patient leaning over out of view of camera. Patient exhibited deep penetration of honey thick liquids during the swallow (PAS 5)  however he was able to transit a small amount of penetrate out of laryngeal vestibule with cued cough. Trace vallecular residuals observed with honey thick liquids. Puree solids transited pharyngeally without difficulty and without penetration, aspiration or residuals. SLP is recommending to initiate a Dys 1 solids, pudding thick liquids diet at this time.   Swallow Evaluation Recommendations       SLP Diet Recommendations: Dysphagia 1 (Puree) solids;Pudding thick liquid   Liquid Administration via: Spoon   Medication Administration: Crushed with puree   Supervision: Patient able to self feed;Full supervision/cueing for compensatory strategies   Compensations: Minimize environmental distractions;Slow rate;Small sips/bites   Postural Changes: Seated upright at 90 degrees   Oral Care Recommendations: Oral care BID       Angela Nevin, MA, CCC-SLP Speech Therapy
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
## 2021-01-07 ENCOUNTER — Inpatient Hospital Stay (HOSPITAL_COMMUNITY): Payer: Self-pay

## 2021-01-07 LAB — BASIC METABOLIC PANEL
Anion gap: 5 (ref 5–15)
BUN: 7 mg/dL (ref 6–20)
CO2: 26 mmol/L (ref 22–32)
Calcium: 8.8 mg/dL — ABNORMAL LOW (ref 8.9–10.3)
Chloride: 106 mmol/L (ref 98–111)
Creatinine, Ser: 0.67 mg/dL (ref 0.61–1.24)
GFR, Estimated: >60 mL/min (ref >60)
Glucose, Bld: 102 mg/dL — ABNORMAL HIGH (ref 70–99)
Potassium: 3.3 mmol/L — ABNORMAL LOW (ref 3.5–5.1)
Sodium: 137 mmol/L (ref 135–145)

## 2021-01-07 LAB — GLUCOSE, CAPILLARY
Glucose-Capillary: 109 mg/dL — ABNORMAL HIGH (ref 70–99)
Glucose-Capillary: 134 mg/dL — ABNORMAL HIGH (ref 70–99)
Glucose-Capillary: 116 mg/dL — ABNORMAL HIGH (ref 70–99)
Glucose-Capillary: 101 mg/dL — ABNORMAL HIGH (ref 70–99)

## 2021-01-07 MED ORDER — QUETIAPINE FUMARATE 25 MG PO TABS
25.0000 mg | ORAL_TABLET | Freq: Two times a day (BID) | ORAL | Status: DC | PRN
  Administered 2021-01-07: 25 mg via ORAL
  Filled 2021-01-07 (×2): qty 1

## 2021-01-07 MED ORDER — POTASSIUM CHLORIDE 20 MEQ PO PACK
40.0000 meq | PACK | Freq: Two times a day (BID) | ORAL | Status: DC
  Administered 2021-01-07 – 2021-01-09 (×5): 40 meq via ORAL
  Filled 2021-01-07 (×5): qty 2

## 2021-01-07 MED ORDER — POTASSIUM CHLORIDE CRYS ER 20 MEQ PO TBCR: 40.0000 meq | EXTENDED_RELEASE_TABLET | Freq: Two times a day (BID) | ORAL | Status: DC

## 2021-01-07 NOTE — Progress Notes (Signed)
Transferred patient via bed to 1512. IVC paperwork given to secretary, notified RN and Diplomatic Services operational officer of XXX patient status for patient safety. Transfer uneventful, report given via phone to RN Terri at 1600.

## 2021-01-07 NOTE — Progress Notes (Addendum)
NAME:  Francis Walters, MRN:  [id number], DOB:  August 19, 1990, LOS: 8 ADMISSION DATE:  12/30/2020, CONSULTATION DATE:  12/30/20 REFERRING MD:  Dr Carmell Austria, CHIEF COMPLAINT:  Acute metabolic encephalopathy   History of Present Illness: -patient also with MRN   30yo male found down 6/27 with strong history of polysubstance abuse. Seen with acute hypoxia due to strep pneumoniae pneumonia RLL and rhabdo. PCCM consulted for further management and admission   Pertinent  Medical History  Polysubstance abuse  Significant Hospital Events: Including procedures, antibiotic start and stop dates in addition to other pertinent events   6/27 admitted with UDS pos amphetamines/ THC / Benzo's 6/29 continues on dexmed, ativan. Got some haldol additionally  6/30 still on dexmed in ICU  7/1 agitation improved, Precedex continues to be weaned  7/2 am vomited ? Asp added flagy 7/3 d/c precedex/ pysch consulted for substance abuse and reported homicidal ideation. 7/4 Psych cleared , they do not believe he is a danger to himself or others. They will continue to follow. He is requesting outpatient services for therapy for his drug use   Scheduled Meds:  chlorhexidine  15 mL Mouth Rinse BID   Chlorhexidine Gluconate Cloth  6 each Topical Daily   cloNIDine  0.1 mg Oral TID   folic acid  1 mg Intravenous Daily   heparin  5,000 Units Subcutaneous Q8H   lactulose  30 g Oral Daily   mouth rinse  15 mL Mouth Rinse q12n4p   nicotine  21 mg Transdermal Daily   pantoprazole (PROTONIX) IV  40 mg Intravenous QHS   thiamine injection  100 mg Intravenous Daily   Continuous Infusions:  sodium chloride Stopped (01/04/21 1810)   cefTRIAXone (ROCEPHIN)  IV Stopped (01/06/21 1126)   dexmedetomidine (PRECEDEX) IV infusion Stopped (01/05/21 1040)   dextrose 5 % and 0.9% NaCl 75 mL/hr at 01/07/21 0300   metronidazole Stopped (01/07/21 0243)   PRN Meds:.docusate sodium, haloperidol **OR** haloperidol lactate,  LORazepam, ondansetron (ZOFRAN) IV, polyethylene glycol     Interim History / Subjective:  Psych has cleared patient. Dr. Judeth Horn will lift the involuntary commitment today. States he has a sore throat. CXR shows persistent left base atelectasis/ infiltrate which is improving   01/05/2021>> Swallow Eval >> dysphagia with suspected motor component as well. During oral phase, patient had mildly delayed transit with puree solids but liquids were all WFL at oral phase. Pharyngeal phase was marked by moderate aspiration before and during swallow due to swallow initiation delay to pyriform sinus and poor timing of laryngeal closure(PAS 7, 8). Patient did react with explosive cough however aspirate had already transited through vocal cords and down into trachea. Recommendation is for Dys 1 solids, pudding thick liquids diet at this time.   Objective   Blood pressure (!) 141/91, pulse 80, temperature 98 F (36.7 C), temperature source Axillary, resp. rate (!) 21, height 5\' 10"  (1.778 m), weight 75.6 kg, SpO2 95 %.        Intake/Output Summary (Last 24 hours) at 01/07/2021 1011 Last data filed at 01/07/2021 0300 Gross per 24 hour  Intake 1879.36 ml  Output 300 ml  Net 1579.36 ml   Filed Weights   01/05/21 0347 01/06/21 0500 01/07/21 0500  Weight: 78.3 kg 75.6 kg 75.6 kg   Physical Exam  Tmax   98.5 (down) General appearance:   Young male supine in bed in NAD, left eye droop At Rest 02 sats  95% on RA  No  jvd, No LAD Oropharynx clear,  mucosa nl, sore to the right corner of his mouth, crusted over. Hoarse voice, very soft phonation Neck supple, few scabs on scalp Bilateral chest excursion, Lungs with junky inps/ exp rhonchi bilaterally, diminished per bases RRR no s3 or or sign murmur Abd thin  with nl excursion , NT, ND, BS + Extr warm with no edema or clubbing noted, brisk refill Neuro Awake and alert, following commands,   no apparent motor deficits   Resolved Hospital Problem list    AKI  Acute hypoxic respiratory failure due to  Sepsis  Assessment & Plan:  Strep pneumo PNA of RLL  -Urine strep pneumo positive P: Continuing rocephin Head of bed elevated 30 degrees. Aggressive pulmonary toilet, IS Q 1 while awake Mobilization , OOB to Chair, PT CXR prn    New HCAP  likely asp 01/04/21  >> added flagyl 7/2 >> NPO >>> recheck cxr 7/5   Acute toxic metabolic encephalopathy  -Likely secondary to substance use, urine drug screen positive for amphetamine, benzos, THC -CT head negative >>> add clonidine am 7/3 and d/c'd  precedex. Continues to tolerate well.   Polysubstance abuse: cocaine, heroin, methamphetamine, THC, tobacco  P: Maintain neuro protective measures Nutrition and bowel regiment  Seizure precautions  Aspirations precautions  Continue to supplement Thiamine, multivitamin, and folic acid  Psych consult 7/4 he has been cleared by psych, but  needs another day as IP to treat pna . Has agreed to Outpatient Program at [facility] at [address]nd floor once discharged Psych will continue to follow   Hx aggressive behavioral disturbances  -reported homicidal behavior P: Psych has cleared, they do not believe he wishes to harm himself or others.   Transaminitis, mild  Hepatitis C -Likely secondary to shock liver -Tylenol level normal Recheck ammonia = 14  01/03/21  P: Avoid hepatotoxins   Wound to Right lip Mother wants checked for STD Plan Will get bacterial and viral culture.    CC APP Time : 35 minutes  Pt. Needs another day or two  for aggressive pulmonary toilet and PT. He remains high risk for aspiration with dysphagia with suspected motor component. He remains on a dysphagia 1 diet. Will transfer to [facility] and transfer to monitored bed.     Best Practice    Diet/type:  Dys 1 solids, pudding thick liquids diet DVT prophylaxis: prophylactic heparin  GI prophylaxis: PPI Lines: N/A Foley:  N/A Code  Status:  full code    Bevelyn Ngo, AGACNP-BC Pulmonary and Critical Care Medicine [facility] Cell [phone]   After 7:00 pm call Elink  [phone] >> hospital use only, not for office use
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
## 2021-01-07 NOTE — Plan of Care (Signed)
  Problem: Safety: Goal: Non-violent Restraint(s) Outcome: Completed/Met   Problem: Pain Managment: Goal: General experience of comfort will improve Outcome: Progressing

## 2021-01-08 ENCOUNTER — Other Ambulatory Visit: Payer: Self-pay

## 2021-01-08 ENCOUNTER — Inpatient Hospital Stay (HOSPITAL_COMMUNITY): Payer: Self-pay

## 2021-01-08 ENCOUNTER — Encounter (HOSPITAL_COMMUNITY): Payer: Self-pay | Admitting: Internal Medicine

## 2021-01-08 DIAGNOSIS — L8990 Pressure ulcer of unspecified site, unspecified stage: Secondary | ICD-10-CM | POA: Insufficient documentation

## 2021-01-08 LAB — GLUCOSE, CAPILLARY
Glucose-Capillary: 87 mg/dL (ref 70–99)
Glucose-Capillary: 113 mg/dL — ABNORMAL HIGH (ref 70–99)
Glucose-Capillary: 87 mg/dL (ref 70–99)
Glucose-Capillary: 106 mg/dL — ABNORMAL HIGH (ref 70–99)
Glucose-Capillary: 143 mg/dL — ABNORMAL HIGH (ref 70–99)
Glucose-Capillary: 96 mg/dL (ref 70–99)

## 2021-01-08 LAB — CBC WITH DIFFERENTIAL/PLATELET
Abs Immature Granulocytes: 0.11 10*3/uL — ABNORMAL HIGH (ref 0.00–0.07)
Basophils Absolute: 0.1 10*3/uL (ref 0.0–0.1)
Basophils Relative: 1 %
Eosinophils Absolute: 0.4 10*3/uL (ref 0.0–0.5)
Eosinophils Relative: 4 %
HCT: 43.7 % (ref 39.0–52.0)
Hemoglobin: 14.6 g/dL (ref 13.0–17.0)
Immature Granulocytes: 1 %
Lymphocytes Relative: 30 %
Lymphs Abs: 2.9 10*3/uL (ref 0.7–4.0)
MCH: 26.8 pg (ref 26.0–34.0)
MCHC: 33.4 g/dL (ref 30.0–36.0)
MCV: 80.2 fL (ref 80.0–100.0)
Monocytes Absolute: 0.9 10*3/uL (ref 0.1–1.0)
Monocytes Relative: 9 %
Neutro Abs: 5.3 10*3/uL (ref 1.7–7.7)
Neutrophils Relative %: 55 %
Platelets: 348 10*3/uL (ref 150–400)
RBC: 5.45 MIL/uL (ref 4.22–5.81)
RDW: 13.4 % (ref 11.5–15.5)
WBC: 9.6 10*3/uL (ref 4.0–10.5)
nRBC: 0 % (ref 0.0–0.2)

## 2021-01-08 LAB — BASIC METABOLIC PANEL
Anion gap: 8 (ref 5–15)
BUN: 9 mg/dL (ref 6–20)
CO2: 26 mmol/L (ref 22–32)
Calcium: 8.9 mg/dL (ref 8.9–10.3)
Chloride: 102 mmol/L (ref 98–111)
Creatinine, Ser: 0.74 mg/dL (ref 0.61–1.24)
GFR, Estimated: >60 mL/min (ref >60)
Glucose, Bld: 87 mg/dL (ref 70–99)
Potassium: 3.6 mmol/L (ref 3.5–5.1)
Sodium: 136 mmol/L (ref 135–145)

## 2021-01-08 LAB — AMMONIA: Ammonia: 37 umol/L — ABNORMAL HIGH (ref 9–35)

## 2021-01-08 MED ORDER — FOLIC ACID 1 MG PO TABS
1.0000 mg | ORAL_TABLET | Freq: Every day | ORAL | Status: DC
  Administered 2021-01-08 – 2021-01-09 (×2): 1 mg via ORAL
  Filled 2021-01-08 (×2): qty 1

## 2021-01-08 MED ORDER — PANTOPRAZOLE SODIUM 40 MG PO TBEC
40.0000 mg | DELAYED_RELEASE_TABLET | Freq: Every day | ORAL | Status: DC
  Administered 2021-01-08: 40 mg via ORAL
  Filled 2021-01-08: qty 1

## 2021-01-08 MED ORDER — THIAMINE HCL 100 MG PO TABS
100.0000 mg | ORAL_TABLET | Freq: Every day | ORAL | Status: DC
  Administered 2021-01-08 – 2021-01-09 (×2): 100 mg via ORAL
  Filled 2021-01-08 (×2): qty 1

## 2021-01-08 MED ORDER — ENOXAPARIN SODIUM 40 MG/0.4ML IJ SOSY
40.0000 mg | PREFILLED_SYRINGE | INTRAMUSCULAR | Status: DC
  Administered 2021-01-08: 40 mg via SUBCUTANEOUS
  Filled 2021-01-08: qty 0.4

## 2021-01-08 NOTE — Progress Notes (Signed)
   01/08/21 1134  Assess: MEWS Score  Temp 98 F (36.7 C)  BP (!) 137/100  Pulse Rate 89  Resp (!) 21  Assess: MEWS Score  MEWS Temp 0  MEWS Systolic 0  MEWS Pulse 0  MEWS RR 1  MEWS LOC 1  MEWS Score 2  MEWS Score Color Yellow  Assess: if the MEWS score is Yellow or Red  Were vital signs taken at a resting state? Yes  Focused Assessment No change from prior assessment  Does the patient meet 2 or more of the SIRS criteria? No  Does the patient have a confirmed or suspected source of infection? No  MEWS guidelines implemented *See Row Information* Yes  Treat  MEWS Interventions Administered scheduled meds/treatments (clonidine)  Pain Scale 0-10  Pain Score 0  Take Vital Signs  Increase Vital Sign Frequency  Yellow: Q 2hr X 2 then Q 4hr X 2, if remains yellow, continue Q 4hrs  Escalate  MEWS: Escalate Yellow: discuss with charge nurse/RN and consider discussing with provider and RRT  Notify: Charge Nurse/RN  Name of Charge Nurse/RN Notified Christena Deem, RN  Date Charge Nurse/RN Notified 01/08/21  Time Charge Nurse/RN Notified 1207  Document  Patient Outcome Not stable and remains on department  Progress note created (see row info) Yes  Assess: SIRS CRITERIA  SIRS Temperature  0  SIRS Pulse 0  SIRS Respirations  1  SIRS WBC 0  SIRS Score Sum  1

## 2021-01-08 NOTE — Progress Notes (Signed)
    BRIEF OVERNIGHT PROGRESS REPORT  Notified by RN for Patient refusing to wear Telemetry monitoring device. Patient is aware of the reasons for this and other monitoring and has continued refusal for this and, as reported to the RN, prior shift also. Telemetry has been discontinued for now due to restriction placed by patient.  Chinita Greenland MSNA ACNPC-AG Acute Care Nurse Practitioner [facility]

## 2021-01-08 NOTE — TOC Progression Note (Signed)
Transition of Care [facility]) - Progression Note    Patient Details  Name: Onyekachi Gathright MRN: [id number] Date of Birth: 1991-04-26  Transition of Care [facility]) CM/SW Contact  Ida Rogue, [place] Phone Number: 01/08/2021, 12:36 PM  Clinical Narrative:   Patient seen in follow up to psychiatry recommendation of substance abuse resources.  Mr Laufer confirms that he wants help with substance abuse, is not interested in residential rehab.  States he would prefer to return home with parents and go to [place].  Contacted Ms Aldean Jewett at [facility] to make referral.  She is reviewing for appropriateness. TOC will continue to follow during the course of hospitalization.     Expected Discharge Plan: Home/Self Care Barriers to Discharge: Barriers Resolved  Expected Discharge Plan and Services Expected Discharge Plan: Home/Self Care   Discharge Planning Services: CM Consult   Living arrangements for the past 2 months: Single Family Home                                       Social Determinants of Health (SDOH) Interventions    Readmission Risk Interventions No flowsheet data found.

## 2021-01-08 NOTE — TOC Progression Note (Signed)
Transition of Care [facility]) - Progression Note    Patient Details  Name: Francis Walters MRN: [id number] Date of Birth: Feb 20, 1991  Transition of Care [facility]) CM/SW Contact  Golda Acre, RN Phone Number: 01/08/2021, 8:35 AM  Clinical Narrative:    Request to rescind the ivc order faxed to the clerk of court at [phone]   Expected Discharge Plan: Home/Self Care Barriers to Discharge: Continued Medical Work up  Expected Discharge Plan and Services Expected Discharge Plan: Home/Self Care   Discharge Planning Services: CM Consult   Living arrangements for the past 2 months: Single Family Home                                       Social Determinants of Health (SDOH) Interventions    Readmission Risk Interventions No flowsheet data found.

## 2021-01-08 NOTE — Progress Notes (Signed)
Occupational Therapy Evaluation  Patient lives with girlfriend in a single level house with 3 steps to enter. 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 Patient displays mild balance deficits with initial mobility, improves with ambulation in hallway. Able to perform lower body dressing, sink side hygiene, toilet transfer and clothing management without any physical assistance. Patient reports feeling a little weaker than baseline, encourage out of bed activity and walking with nursing. No further acute OT needs, will sign off. Please re-consult if new needs arise.     01/08/21 1200  OT Visit Information  Last OT Received On 01/08/21  Assistance Needed +1  PT/OT/SLP Co-Evaluation/Treatment Yes  Reason for Co-Treatment To address functional/ADL transfers  PT goals addressed during session Mobility/safety with mobility  OT goals addressed during session ADL's and self-care  History of Present Illness 30yo male found down with strong history of polysubstance abuse. Seen with acute hypoxia due to strep pneumoniae pneumonia RLL and rhabdo.  Precautions  Precautions Fall  Precaution Comments pt denies h/o falls in past 1 year  Restrictions  Weight Bearing Restrictions No  Home Living  Family/patient expects to be discharged to: Private residence  Living Arrangements Other (Comment) (girlfriend)  Available Help at Discharge Family  Type of Home House  Home Access Stairs to enter  Entrance Stairs-Number of Steps 3  Home Layout One level  Home Equipment None  Prior Function  Level of Independence Independent  Comments works Occupational hygienist Expressive difficulties (low volume)  Pain Assessment  Pain Assessment Faces  Faces Pain Scale 4  Pain Location "entire L side"  Pain Descriptors / Indicators Sore  Pain Intervention(s) Monitored during session  Cognition  Arousal/Alertness Awake/alert  Behavior During Therapy WFL for tasks  assessed/performed;Flat affect  Overall Cognitive Status Within Functional Limits for tasks assessed  Upper Extremity Assessment  Upper Extremity Assessment Overall WFL for tasks assessed  Lower Extremity Assessment  Lower Extremity Assessment Defer to PT evaluation  Cervical / Trunk Assessment  Cervical / Trunk Assessment Normal  ADL  Overall ADL's  Independent  General ADL Comments patient is able to don socks, wash hands at sink, perform toilet tasks without any physical assistance. note minor balance deficts initially with walking however resolved with continued mobility  Bed Mobility  Overal bed mobility Independent  Transfers  Overall transfer level Independent  Equipment used None  General transfer comment mild balance deficits however no overt loss of balance  Balance  Overall balance assessment Mild deficits observed, not formally tested  OT - End of Session  Activity Tolerance Patient tolerated treatment well  Patient left in bed;with call bell/phone within reach;with nursing/sitter in room  Nurse Communication Mobility status  OT Assessment  OT Recommendation/Assessment Patient does not need any further OT services  OT Visit Diagnosis Unsteadiness on feet (R26.81)  OT Problem List Impaired balance (sitting and/or standing)  AM-PAC OT "6 Clicks" Daily Activity Outcome Measure (Version 2)  Help from another person eating meals? 4  Help from another person taking care of personal grooming? 4  Help from another person toileting, which includes using toliet, bedpan, or urinal? 4  Help from another person bathing (including washing, rinsing, drying)? 4  Help from another person to put on and taking off regular upper body clothing? 4  Help from another person to put on and taking off regular lower body clothing? 4  6 Click Score 24  OT Recommendation  Follow Up Recommendations No OT follow up  OT Equipment None  recommended by OT  Acute Rehab OT Goals  Patient Stated Goal pt  likes to drive, works in Scientist, water quality All assessment and education complete, DC therapy  OT Time Calculation  OT Start Time (ACUTE ONLY) [id number]  OT Stop Time (ACUTE ONLY) 0941  OT Time Calculation (min) 17 min  OT General Charges  $OT Visit 1 Visit  OT Evaluation  $OT Eval Low Complexity 1 Low  Written Expression  Dominant Hand  (did not specify)   Marlyce Huge OT OT pager: [phone]
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
## 2021-01-08 NOTE — Progress Notes (Signed)
Pt is off the unit , gone for MRI

## 2021-01-08 NOTE — Progress Notes (Signed)
PROGRESS NOTE  Francis Walters  IOM:[id number] DOB: 09/30/1990 DOA: 12/30/2020 PCP: Pcp, No  Brief Narrative: 6/27 admitted unresponsive with overdose, rhabdomyolysis and RLL pneumonia. UDS pos amphetamines/ THC / Benzo's 6/29 continues on dexmed, ativan. Got some haldol additionally 6/30 still on dexmed in ICU 7/1 agitation improved, Precedex continues to be weaned 7/2 am vomited ? Asp added flagy 7/3 d/c precedex/ pysch consulted for substance abuse and reported homicidal ideation. 7/4 Psych cleared , they do not believe he is a danger to himself or others. They will continue to follow. He is requesting outpatient services for therapy for his drug use  Assessment & Plan: Active Problems:   Encephalopathy   HCAP (healthcare-associated pneumonia)   Streptococcal pneumonia (HCC)   Aspiration into airway   Hypoxia   Methamphetamine abuse (HCC)   Pressure injury of skin  Polysubstance abuse with unintentional overdose: +UDS - Psychiatry has cleared the patient from IVC, suicide precautions, etc. CSW providing resources as pt is willing to enroll in Tx. Has agreed to Outpatient Program at [facility] at [address], 2nd [address] once discharged  Rhabdomyolysis, chock liver: Improved.   RLL CAP due to S. pneumococcus:  - Continue ceftriaxone  Aspiration pneumonia: Cause of decompensation 7/2 (clinically improved).  - Continue ceftriaxone, flagyl - Continue flutter valve and incentive spirometry (discussed at length today)  Neurological deficits: Left ptosis, persistent dysphagia, palatal elevation, also persistent hoarseness/weak voice concerning for central lesion.  - CT head initially nonacute.  - Check MRI. Pt w/strong positive FH CNS tumors.   Dysphagia:  - Dys 3 per SLP, planning MBS after MR results returned.  Acute toxic metabolic encephalopathy: Improved. No longer appears to be withdrawing.  Seborrheic dermatitis: Primarily affecting scalp. No  significant inflammatory component. Area where he's scratched persistently noted with tiny healed wound. - Recommend selenium sulfide shampoo as outpatient.   DVT prophylaxis: Change to lovenox Code Status: Full Family Communication: At bedside Disposition Plan:  Status is: Inpatient  Remains inpatient appropriate because:Ongoing diagnostic testing needed not appropriate for outpatient work up  Dispo: The patient is from: Home              Anticipated d/c is to: Home              Patient currently is not medically stable to d/c.   Difficult to place patient No       Consultants:  PCCM Psychiatry  Procedures:  None  Antimicrobials: Ceftriaxone, flagyl   Subjective: Pt eager for discharge, willing to remain inpatient. Wants to eat full diet. No SI, HI, etc.   Objective: Vitals:   01/08/21 0442 01/08/21 1134 01/08/21 1342 01/08/21 1557  BP:  (!) 137/100 (!) 138/92 (!) 136/94  Pulse:  89 94 (!) 114  Resp:  (!) 21  18  Temp:  98 F (36.7 C) 97.9 F (36.6 C) (!) 97.4 F (36.3 C)  TempSrc:  Oral Oral   SpO2:   99% 100%  Weight: 77 kg     Height:        Intake/Output Summary (Last 24 hours) at 01/08/2021 1819 Last data filed at 01/08/2021 8453 Gross per 24 hour  Intake --  Output 600 ml  Net -600 ml   Filed Weights   01/06/21 0500 01/07/21 0500 01/08/21 0442  Weight: 75.6 kg 75.6 kg 77 kg    Gen: 30 y.o. male in no distress Pulm: Non-labored breathing room air. Clear to auscultation bilaterally.  CV: Regular rate  and rhythm. No murmur, rub, or gallop. No JVD, no pitting pedal edema. GI: Abdomen soft, non-tender, non-distended, with normoactive bowel sounds. No organomegaly or masses felt. Ext: Warm, no deformities Skin: Diffuse tattoos.  Neuro: Alert and oriented. Left > right ptosis, and lingual protrusion. Full painless EOM, PERRL, facial sensation wnl. No peripheral deficits.  Psych: Judgement and insight appear fair.  Data Reviewed: I have personally  reviewed following labs and imaging studies  CBC: Recent Labs  Lab 01/02/21 0258 01/08/21 0426  WBC 10.7* 9.6  NEUTROABS  --  5.3  HGB 13.4 14.6  HCT 40.7 43.7  MCV 83.1 80.2  PLT 174 348   Basic Metabolic Panel: Recent Labs  Lab 01/02/21 0258 01/04/21 0241 01/07/21 0746 01/08/21 0426  NA 141 139 137 136  K 3.9 3.4* 3.3* 3.6  CL 106 105 106 102  CO2 GLUCOSE 85 87 102* 87  BUN CREATININE 0.80 0.83 0.67 0.74  CALCIUM 8.8* 8.9 8.8* 8.9   GFR: Estimated Creatinine Clearance: 143.2 mL/min (by C-G formula based on SCr of 0.74 mg/dL). Liver Function Tests: Recent Labs  Lab 01/02/21 0258 01/04/21 0241  AST 186* 87*  ALT 244* 150*  ALKPHOS 41 48  BILITOT 1.3* 0.9  PROT 6.1* 7.2  ALBUMIN 3.0* 3.5   No results for input(s): LIPASE, AMYLASE in the last 168 hours. Recent Labs  Lab 01/03/21 0855 01/03/21 1750 01/08/21 0426  AMMONIA 74* 14 37*   Coagulation Profile: No results for input(s): INR, PROTIME in the last 168 hours. Cardiac Enzymes: No results for input(s): CKTOTAL, CKMB, CKMBINDEX, TROPONINI in the last 168 hours. BNP (last 3 results) No results for input(s): PROBNP in the last 8760 hours. HbA1C: No results for input(s): HGBA1C in the last 72 hours. CBG: Recent Labs  Lab 01/08/21 0131 01/08/21 0531 01/08/21 0757 01/08/21 1200 01/08/21 1558  GLUCAP 87 113* 87 106* 143*   Lipid Profile: No results for input(s): CHOL, HDL, LDLCALC, TRIG, CHOLHDL, LDLDIRECT in the last 72 hours. Thyroid Function Tests: No results for input(s): TSH, T4TOTAL, FREET4, T3FREE, THYROIDAB in the last 72 hours. Anemia Panel: No results for input(s): VITAMINB12, FOLATE, FERRITIN, TIBC, IRON, RETICCTPCT in the last 72 hours. Urine analysis:    Component Value Date/Time   COLORURINE YELLOW 12/30/2020 1841   APPEARANCEUR HAZY (A) 12/30/2020 1841   LABSPEC 1.018 12/30/2020 1841   PHURINE 5.0 12/30/2020 1841   GLUCOSEU NEGATIVE 12/30/2020 1841    HGBUR MODERATE (A) 12/30/2020 1841   BILIRUBINUR NEGATIVE 12/30/2020 1841   KETONESUR NEGATIVE 12/30/2020 1841   PROTEINUR 30 (A) 12/30/2020 1841   NITRITE NEGATIVE 12/30/2020 1841   LEUKOCYTESUR NEGATIVE 12/30/2020 1841   Recent Results (from the past 240 hour(s))  Blood Culture (routine x 2)     Status: None   Collection Time: 12/30/20  5:16 PM   Specimen: BLOOD  Result Value Ref Range Status   Specimen Description   Final    BLOOD RIGHT ANTECUBITAL Performed at [facility], [address] [place], [place] [address]    Special Requests   Final    BOTTLES DRAWN AEROBIC AND ANAEROBIC Blood Culture adequate volume Performed at [facility], [address] [place], [place] [address]    Culture   Final    NO GROWTH 5 DAYS Performed at [facility], [address], [place], [place] [address]    Report Status 01/04/2021 FINAL  Final  Resp Panel by RT-PCR (Flu A&B, Covid) Nasopharyngeal Swab     Status: None   Collection Time: 12/30/20  6:08 PM   Specimen: Nasopharyngeal Swab; Nasopharyngeal(NP) swabs in vial transport medium  Result Value Ref Range Status   SARS Coronavirus 2 by RT PCR NEGATIVE NEGATIVE Final    Comment: (NOTE) SARS-CoV-2 target nucleic acids are NOT DETECTED.  The SARS-CoV-2 RNA is generally detectable in upper respiratory specimens during the acute phase of infection. The lowest concentration of SARS-CoV-2 viral copies this assay can detect is 138 copies/mL. A negative result does not preclude SARS-Cov-2 infection and should not be used as the sole basis for treatment or other patient management decisions. A negative result may occur with  improper specimen collection/handling, submission of specimen other than nasopharyngeal swab, presence of viral mutation(s) within the areas targeted by this assay, and inadequate number of viral copies(<138 copies/mL). A negative result must be combined with clinical  observations, patient history, and epidemiological information. The expected result is Negative.  Fact Sheet for Patients:  BloggerCourse.com  Fact Sheet for Healthcare Providers:  SeriousBroker.it  This test is no t yet approved or cleared by the [place] FDA and  has been authorized for detection and/or diagnosis of SARS-CoV-2 by FDA under an Emergency Use Authorization (EUA). This EUA will remain  in effect (meaning this test can be used) for the duration of the COVID-19 declaration under Section 564(b)(1) of the Act, 21 U.S.C.section 360bbb-3(b)(1), unless the authorization is terminated  or revoked sooner.       Influenza A by PCR NEGATIVE NEGATIVE Final   Influenza B by PCR NEGATIVE NEGATIVE Final    Comment: (NOTE) The Xpert Xpress SARS-CoV-2/FLU/RSV plus assay is intended as an aid in the diagnosis of influenza from Nasopharyngeal swab specimens and should not be used as a sole basis for treatment. Nasal washings and aspirates are unacceptable for Xpert Xpress SARS-CoV-2/FLU/RSV testing.  Fact Sheet for Patients: BloggerCourse.com  Fact Sheet for Healthcare Providers: SeriousBroker.it  This test is not yet approved or cleared by the [place] FDA and has been authorized for detection and/or diagnosis of SARS-CoV-2 by FDA under an Emergency Use Authorization (EUA). This EUA will remain in effect (meaning this test can be used) for the duration of the COVID-19 declaration under Section 564(b)(1) of the Act, 21 U.S.C. section 360bbb-3(b)(1), unless the authorization is terminated or revoked.  Performed at [facility], [address] [place], [place] [address]   Urine culture     Status: None   Collection Time: 12/30/20  6:41 PM   Specimen: In/Out Cath Urine  Result Value Ref Range Status   Specimen Description   Final    IN/OUT CATH  URINE Performed at [facility], [address] [place], [place] [address]    Special Requests   Final    NONE Performed at [facility], [address], [place], [place] [address]    Culture   Final    NO GROWTH Performed at [facility], [address], [place], [place] [address]    Report Status 01/01/2021 FINAL  Final  MRSA PCR Screening     Status: Abnormal   Collection Time: 12/31/20  3:30 AM  Result Value Ref Range Status   MRSA by PCR POSITIVE (A) NEGATIVE Final    Comment:        The GeneXpert MRSA Assay (FDA approved for NASAL specimens only), is one component of a comprehensive  MRSA colonization surveillance program. It is not intended to diagnose MRSA infection nor to guide or monitor treatment for MRSA infections. RESULT CALLED TO, READ BACK BY AND VERIFIED WITH: VICTORIA, RN @ 0532 ON 12/31/20 Riesa Pope Performed at [facility], [address] [place], [place] [address]   Blood Culture (routine x 2)     Status: None   Collection Time: 12/31/20  5:22 AM   Specimen: BLOOD RIGHT HAND  Result Value Ref Range Status   Specimen Description   Final    BLOOD RIGHT HAND Performed at [facility], [address] [place], [place] [address]    Special Requests   Final    BOTTLES DRAWN AEROBIC ONLY Blood Culture adequate volume Performed at [facility], [address], [place], [place] [address]    Culture   Final    NO GROWTH 5 DAYS Performed at [facility], [address] [place], [place] [address]    Report Status 01/05/2021 FINAL  Final  Culture, blood (Routine X 2) w Reflex to ID Panel     Status: None   Collection Time: 12/31/20  5:29 AM   Specimen: BLOOD LEFT HAND  Result Value Ref Range Status   Specimen Description   Final    BLOOD LEFT HAND Performed at [facility], [address] [place], [place] [address]    Special  Requests   Final    BOTTLES DRAWN AEROBIC AND ANAEROBIC Blood Culture adequate volume Performed at [facility], [address], [place], [place] [address]    Culture   Final    NO GROWTH 5 DAYS Performed at [facility], [address], [place], [place] [address]    Report Status 01/05/2021 FINAL  Final  Aerobic/Anaerobic Culture w Gram Stain (surgical/deep wound)     Status: None (Preliminary result)   Collection Time: 01/07/21 12:27 PM   Specimen: Mouth  Result Value Ref Range Status   Specimen Description MOUTH  Final   Special Requests NONE  Final   Gram Stain NO WBC SEEN NO ORGANISMS SEEN   Final   Culture   Final    FEW STAPHYLOCOCCUS AUREUS SUSCEPTIBILITIES TO FOLLOW Performed at [facility], [address], [place], [place] [address]    Report Status PENDING  Incomplete      Radiology Studies: DG Chest Port 1 View  Result Date: 01/07/2021 CLINICAL DATA:  Aspiration. EXAM: PORTABLE CHEST 1 VIEW COMPARISON:  01/04/2021.  CT 11/15/2020. FINDINGS: Patient is rotated slightly to the right. Mediastinum is stable heart size stable. Persistent left base atelectasis/infiltrate with improvement from prior exam. No pleural effusion or pneumothorax. No acute bony abnormality. IMPRESSION: Persistent left base atelectasis/infiltrate with improvement from prior exam. Electronically Signed   By: Maisie Fus  Register   On: 01/07/2021 05:48    Scheduled Meds:  chlorhexidine  15 mL Mouth Rinse BID   Chlorhexidine Gluconate Cloth  6 each Topical Daily   cloNIDine  0.1 mg Oral TID   enoxaparin (LOVENOX) injection  40 mg Subcutaneous Q24H   folic acid  1 mg Oral Daily   lactulose  30 g Oral Daily   mouth rinse  15 mL Mouth Rinse q12n4p   nicotine  21 mg Transdermal Daily   pantoprazole  40 mg Oral QHS   potassium chloride  40 mEq Oral BID   thiamine  100 mg Oral Daily   Continuous Infusions:  sodium  chloride Stopped (01/04/21 1810)   metronidazole 500 mg  (01/08/21 1124)     LOS: 9 days   Time spent: 35 minutes.  Tyrone Nineyan B Jacarri Gesner, MD [facility] [email or web address] 01/08/2021, 6:19 PM

## 2021-01-08 NOTE — Evaluation (Signed)
Physical Therapy Evaluation Patient Details Name: Francis Walters MRN: [id number] DOB: Mar 21, 1991 Today's Date: 01/08/2021   History of Present Illness  29yo male found down with strong history of polysubstance abuse. Seen with acute hypoxia due to strep pneumoniae pneumonia RLL and rhabdo.  Clinical Impression  Pt ambulated 250' without an assistive device with no overt loss of balance. He had mild scissoring gait during his first few steps, but this resolved quickly. He is able to perform higher level balance activities without loss of balance. Encouraged pt to ambulate in halls with sitter 2-3x/day to minimize deconditioning during hospitalization. No further acute PT is indicated. From a PT standpoint, he is ready to DC home. Will sign off.     Follow Up Recommendations No PT follow up    Equipment Recommendations  None recommended by PT    Recommendations for Other Services       Precautions / Restrictions Precautions Precautions: Fall Precaution Comments: pt denies h/o falls in past 1 year Restrictions Weight Bearing Restrictions: No      Mobility  Bed Mobility Overal bed mobility: Independent                  Transfers Overall transfer level: Needs assistance Equipment used: None Transfers: Sit to/from Stand Sit to Stand: Supervision         General transfer comment: supervision for safety, pt has a sitter who reported pt was mildly unsteady when he walked to the bathroom earlier today  Ambulation/Gait Ambulation/Gait assistance: Supervision Gait Distance (Feet): 250 Feet Assistive device: None Gait Pattern/deviations: Step-through pattern;Scissoring;Narrow base of support Gait velocity: WFL   General Gait Details: mild scissoring during first few steps, no loss of balance; pt able to walk while performing head turns to L and R and looking up/down without loss of balance, pt able to reach to floor without loss of balance  Stairs             Wheelchair Mobility    Modified [place] (Stroke Patients Only)       Balance Overall balance assessment: Needs assistance   Sitting balance-Leahy Scale: Normal       Standing balance-Leahy Scale: Good                 High Level Balance Comments: able to walk while turning head L/R and looking up/down without loss of balance, can reach to floor without loss of balance             Pertinent Vitals/Pain Pain Assessment: 0-10 Pain Score: 7  Pain Location: "entire L side" Pain Descriptors / Indicators: Sore Pain Intervention(s): Limited activity within patient's tolerance;Monitored during session    Home Living Family/patient expects to be discharged to:: Private residence Living Arrangements: Parent Available Help at Discharge: Family   Home Access: Stairs to enter   Secretary/administrator of Steps: 3 Home Layout: One level Home Equipment: None      Prior Function Level of Independence: Independent               Hand Dominance        Extremity/Trunk Assessment   Upper Extremity Assessment Upper Extremity Assessment: Defer to OT evaluation    Lower Extremity Assessment Lower Extremity Assessment: Overall WFL for tasks assessed    Cervical / Trunk Assessment Cervical / Trunk Assessment: Normal  Communication   Communication: Expressive difficulties (low volume)  Cognition Arousal/Alertness: Awake/alert Behavior During Therapy: WFL for tasks assessed/performed;Flat affect Overall Cognitive Status: Within Functional Limits  for tasks assessed                                        General Comments      Exercises     Assessment/Plan    PT Assessment Patent does not need any further PT services  PT Problem List         PT Treatment Interventions      PT Goals (Current goals can be found in the Care Plan section)  Acute Rehab PT Goals Patient Stated Goal: pt likes to drive, works in [employer] representative PT Goal  Formulation: All assessment and education complete, DC therapy    Frequency     Barriers to discharge        Co-evaluation PT/OT/SLP Co-Evaluation/Treatment: Yes Reason for Co-Treatment: For patient/therapist safety;To address functional/ADL transfers PT goals addressed during session: Mobility/safety with mobility;Balance         AM-PAC PT "6 Clicks" Mobility  Outcome Measure Help needed turning from your back to your side while in a flat bed without using bedrails?: None Help needed moving from lying on your back to sitting on the side of a flat bed without using bedrails?: None Help needed moving to and from a bed to a chair (including a wheelchair)?: None Help needed standing up from a chair using your arms (e.g., wheelchair or bedside chair)?: None Help needed to walk in hospital room?: None Help needed climbing 3-5 steps with a railing? : None 6 Click Score: 24    End of Session Equipment Utilized During Treatment: Gait belt Activity Tolerance: Patient tolerated treatment well Patient left: in bed;with call bell/phone within reach;with nursing/sitter in room Nurse Communication: Mobility status      Time: 6270-3500 PT Time Calculation (min) (ACUTE ONLY): 17 min   Charges:   PT Evaluation $PT Eval Low Complexity: 1 Low         Tamala Ser PT 01/08/2021  Acute Rehabilitation Services Pager [phone] Office [phone]

## 2021-01-09 ENCOUNTER — Encounter (HOSPITAL_COMMUNITY): Payer: Self-pay

## 2021-01-09 DIAGNOSIS — R945 Abnormal results of liver function studies: Secondary | ICD-10-CM

## 2021-01-09 DIAGNOSIS — T17908D Unspecified foreign body in respiratory tract, part unspecified causing other injury, subsequent encounter: Secondary | ICD-10-CM

## 2021-01-09 DIAGNOSIS — E875 Hyperkalemia: Secondary | ICD-10-CM

## 2021-01-09 DIAGNOSIS — N179 Acute kidney failure, unspecified: Secondary | ICD-10-CM

## 2021-01-09 LAB — COMPREHENSIVE METABOLIC PANEL
ALT: 63 U/L — ABNORMAL HIGH (ref 0–44)
AST: 46 U/L — ABNORMAL HIGH (ref 15–41)
Albumin: 3.8 g/dL (ref 3.5–5.0)
Alkaline Phosphatase: 41 U/L (ref 38–126)
Anion gap: 9 (ref 5–15)
BUN: 12 mg/dL (ref 6–20)
CO2: 25 mmol/L (ref 22–32)
Calcium: 9.1 mg/dL (ref 8.9–10.3)
Chloride: 102 mmol/L (ref 98–111)
Creatinine, Ser: 0.89 mg/dL (ref 0.61–1.24)
GFR, Estimated: >60 mL/min (ref >60)
Glucose, Bld: 83 mg/dL (ref 70–99)
Potassium: 3.6 mmol/L (ref 3.5–5.1)
Sodium: 136 mmol/L (ref 135–145)
Total Bilirubin: 0.7 mg/dL (ref 0.3–1.2)
Total Protein: 7.5 g/dL (ref 6.5–8.1)

## 2021-01-09 LAB — GLUCOSE, CAPILLARY
Glucose-Capillary: 149 mg/dL — ABNORMAL HIGH (ref 70–99)
Glucose-Capillary: 95 mg/dL (ref 70–99)
Glucose-Capillary: 97 mg/dL (ref 70–99)

## 2021-01-09 NOTE — TOC Transition Note (Signed)
Transition of Care [facility]) - CM/SW Discharge Note   Patient Details  Name: Francis Walters MRN: [id number] Date of Birth: September 28, 1990  [facility] CM/SW Contact:  Ida Rogue, LCSW Phone Number: 01/09/2021, 11:13 AM   Clinical Narrative:   Found out that [facility] is not currently offering SAIOP.  Patient given information about [facility] as resource for substance abuse treatment. No further needs identified.  TOC sign off.    Final next level of care: Home/Self Care Barriers to Discharge: Barriers Resolved   Patient Goals and CMS Choice Patient states their goals for this hospitalization and ongoing recovery are:: unable to state      Discharge Placement                       Discharge Plan and Services   Discharge Planning Services: CM Consult                                 Social Determinants of Health (SDOH) Interventions     Readmission Risk Interventions No flowsheet data found.
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
## 2021-01-11 DIAGNOSIS — J189 Pneumonia, unspecified organism: Secondary | ICD-10-CM

## 2021-01-11 DIAGNOSIS — E875 Hyperkalemia: Secondary | ICD-10-CM

## 2021-01-11 DIAGNOSIS — R945 Abnormal results of liver function studies: Secondary | ICD-10-CM

## 2021-01-11 DIAGNOSIS — N179 Acute kidney failure, unspecified: Secondary | ICD-10-CM

## 2021-01-11 LAB — HCV RNA QUANT
HCV Quantitative Log: 5.816 log10 IU/mL (ref >1.70)
HCV Quantitative: 654000 IU/mL (ref >50)

## 2021-01-12 LAB — AEROBIC/ANAEROBIC CULTURE W GRAM STAIN (SURGICAL/DEEP WOUND): Gram Stain: NONE SEEN

## 2021-01-14 LAB — ACETYLCHOLINE RECEPTOR AB, ALL
Acety choline binding ab: 0.15 nmol/L (ref 0.00–0.24)
Acetylchol Block Ab: 18 % (ref 0–25)

## 2021-01-15 LAB — VIRUS CULTURE

## 2021-01-23 ENCOUNTER — Inpatient Hospital Stay (INDEPENDENT_AMBULATORY_CARE_PROVIDER_SITE_OTHER): Payer: Self-pay | Admitting: Primary Care

## 2022-03-29 ENCOUNTER — Emergency Department (HOSPITAL_COMMUNITY)
Admission: EM | Admit: 2022-03-29 | Discharge: 2022-03-29 | Disposition: A | Discharge status: Home / Self Care | Payer: Commercial Managed Care - HMO | Attending: Emergency Medicine | Admitting: Emergency Medicine

## 2022-03-29 ENCOUNTER — Encounter (HOSPITAL_COMMUNITY): Payer: Self-pay | Admitting: Emergency Medicine

## 2022-03-29 ENCOUNTER — Emergency Department (HOSPITAL_COMMUNITY): Payer: Commercial Managed Care - HMO

## 2022-03-29 DIAGNOSIS — Z23 Encounter for immunization: Secondary | ICD-10-CM | POA: Diagnosis not present

## 2022-03-29 DIAGNOSIS — W228XXA Striking against or struck by other objects, initial encounter: Secondary | ICD-10-CM | POA: Insufficient documentation

## 2022-03-29 DIAGNOSIS — R000 Tachycardia, unspecified: Secondary | ICD-10-CM | POA: Diagnosis not present

## 2022-03-29 DIAGNOSIS — S0101XA Laceration without foreign body of scalp, initial encounter: Secondary | ICD-10-CM | POA: Diagnosis not present

## 2022-03-29 DIAGNOSIS — S0990XA Unspecified injury of head, initial encounter: Secondary | ICD-10-CM

## 2022-03-29 MED ORDER — LIDOCAINE-EPINEPHRINE-TETRACAINE (LET) TOPICAL GEL
3.0000 mL | Freq: Once | TOPICAL | Status: AC
  Administered 2022-03-29: 3 mL via TOPICAL
  Filled 2022-03-29: qty 3

## 2022-03-29 MED ORDER — TETANUS-DIPHTH-ACELL PERTUSSIS 5-2.5-18.5 LF-MCG/0.5 IM SUSY
0.5000 mL | PREFILLED_SYRINGE | Freq: Once | INTRAMUSCULAR | Status: AC
  Administered 2022-03-29: 0.5 mL via INTRAMUSCULAR
  Filled 2022-03-29: qty 0.5

## 2022-03-29 MED ORDER — ACETAMINOPHEN 325 MG PO TABS: 650.0000 mg | ORAL_TABLET | Freq: Once | ORAL | Status: DC

## 2022-03-29 MED ORDER — NAPROXEN 500 MG PO TABS: 500.0000 mg | ORAL_TABLET | Freq: Once | ORAL | Status: DC
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
## 2022-11-20 ENCOUNTER — Emergency Department (HOSPITAL_COMMUNITY)
Admission: EM | Admit: 2022-11-20 | Discharge: 2022-11-20 | Disposition: A | Discharge status: Home / Self Care | Payer: 59 | Attending: Emergency Medicine | Admitting: Emergency Medicine

## 2022-11-20 ENCOUNTER — Other Ambulatory Visit: Payer: Self-pay

## 2022-11-20 ENCOUNTER — Encounter (HOSPITAL_COMMUNITY): Payer: Self-pay | Admitting: Emergency Medicine

## 2022-11-20 DIAGNOSIS — R0981 Nasal congestion: Secondary | ICD-10-CM | POA: Diagnosis not present

## 2022-11-20 DIAGNOSIS — T50901A Poisoning by unspecified drugs, medicaments and biological substances, accidental (unintentional), initial encounter: Secondary | ICD-10-CM

## 2022-11-20 DIAGNOSIS — R7401 Elevation of levels of liver transaminase levels: Secondary | ICD-10-CM | POA: Diagnosis not present

## 2022-11-20 DIAGNOSIS — E876 Hypokalemia: Secondary | ICD-10-CM | POA: Insufficient documentation

## 2022-11-20 DIAGNOSIS — T401X1A Poisoning by heroin, accidental (unintentional), initial encounter: Secondary | ICD-10-CM | POA: Diagnosis present

## 2022-11-20 LAB — RAPID URINE DRUG SCREEN, HOSP PERFORMED
Amphetamines: POSITIVE — AB
Barbiturates: NOT DETECTED
Benzodiazepines: POSITIVE — AB
Cocaine: NOT DETECTED
Opiates: NOT DETECTED
Tetrahydrocannabinol: POSITIVE — AB

## 2022-11-20 LAB — CBC WITH DIFFERENTIAL/PLATELET
Abs Immature Granulocytes: 0.02 10*3/uL (ref 0.00–0.07)
Basophils Absolute: 0 10*3/uL (ref 0.0–0.1)
Basophils Relative: 1 %
Eosinophils Absolute: 0.1 10*3/uL (ref 0.0–0.5)
Eosinophils Relative: 1 %
HCT: 44.2 % (ref 39.0–52.0)
Hemoglobin: 14.3 g/dL (ref 13.0–17.0)
Immature Granulocytes: 0 %
Lymphocytes Relative: 27 %
Lymphs Abs: 2.3 10*3/uL (ref 0.7–4.0)
MCH: 26.6 pg (ref 26.0–34.0)
MCHC: 32.4 g/dL (ref 30.0–36.0)
MCV: 82.2 fL (ref 80.0–100.0)
Monocytes Absolute: 0.8 10*3/uL (ref 0.1–1.0)
Monocytes Relative: 9 %
Neutro Abs: 5.5 10*3/uL (ref 1.7–7.7)
Neutrophils Relative %: 62 %
Platelets: 233 10*3/uL (ref 150–400)
RBC: 5.38 MIL/uL (ref 4.22–5.81)
RDW: 12.2 % (ref 11.5–15.5)
WBC: 8.7 10*3/uL (ref 4.0–10.5)
nRBC: 0 % (ref 0.0–0.2)

## 2022-11-20 LAB — COMPREHENSIVE METABOLIC PANEL
ALT: 75 U/L — ABNORMAL HIGH (ref 0–44)
AST: 77 U/L — ABNORMAL HIGH (ref 15–41)
Albumin: 3.7 g/dL (ref 3.5–5.0)
Alkaline Phosphatase: 44 U/L (ref 38–126)
Anion gap: 9 (ref 5–15)
BUN: 16 mg/dL (ref 6–20)
CO2: 26 mmol/L (ref 22–32)
Calcium: 8.9 mg/dL (ref 8.9–10.3)
Chloride: 104 mmol/L (ref 98–111)
Creatinine, Ser: 1.16 mg/dL (ref 0.61–1.24)
GFR, Estimated: >60 mL/min (ref >60)
Glucose, Bld: 111 mg/dL — ABNORMAL HIGH (ref 70–99)
Potassium: 3.4 mmol/L — ABNORMAL LOW (ref 3.5–5.1)
Sodium: 139 mmol/L (ref 135–145)
Total Bilirubin: 1.2 mg/dL (ref 0.3–1.2)
Total Protein: 7.3 g/dL (ref 6.5–8.1)

## 2022-11-20 LAB — CBG MONITORING, ED: Glucose-Capillary: 105 mg/dL — ABNORMAL HIGH (ref 70–99)

## 2022-11-20 LAB — ETHANOL: Alcohol, Ethyl (B): <10 mg/dL (ref <10)

## 2022-11-20 LAB — ACETAMINOPHEN LEVEL: Acetaminophen (Tylenol), Serum: <10 ug/mL — ABNORMAL LOW (ref 10–30)

## 2022-11-20 LAB — SALICYLATE LEVEL: Salicylate Lvl: <7 mg/dL — ABNORMAL LOW (ref 7.0–30.0)

## 2022-11-20 MED ORDER — NALOXONE HCL 0.4 MG/ML IJ SOLN: 0.4000 mg | INTRAMUSCULAR | Status: DC | PRN

## 2022-11-20 NOTE — Discharge Instructions (Addendum)
Follow up with behavioral health urgent care as needed. Consider outpatient substance abuse resource guide.

## 2022-11-20 NOTE — ED Provider Notes (Signed)
[place] EMERGENCY DEPARTMENT AT [facility] Provider Note   CSN: [id number] Arrival date & time: 11/20/22  0415     History  Chief Complaint  Patient presents with   Drug Overdose    Francis Walters is a 32 y.o. male.  32 year old male brought in by EMS from hotel for drug overdose, states he sniffed heroin tonight, denies any other drug use. Given 0.4mg  IV as well as 2mg  IN. Arrives rousable to verbal stimuli, tearful. Denies SI.        Home Medications Prior to Admission medications   Medication Sig Start Date End Date Taking? Authorizing Provider  ibuprofen (ADVIL) 200 MG tablet Take 200 mg by mouth every 6 (six) hours as needed for moderate pain.    [provider]      Allergies    Penicillins and Penicillins    Review of Systems   Review of Systems Negative except as per HPI Physical Exam Updated Vital Signs BP 116/71   Pulse 71   Temp 98.1 F (36.7 C) (Oral)   Resp 13   Ht 6' (1.829 m)   Wt 83.9 kg   SpO2 99%   BMI 25.09 kg/m  Physical Exam Vitals and nursing note reviewed.  Constitutional:      General: He is not in acute distress.    Appearance: He is well-developed. He is not diaphoretic.  HENT:     Head: Normocephalic and atraumatic.     Nose: Congestion present.  Eyes:     Extraocular Movements: Extraocular movements intact.  Cardiovascular:     Rate and Rhythm: Normal rate and regular rhythm.     Heart sounds: Normal heart sounds.  Pulmonary:     Effort: Pulmonary effort is normal.     Breath sounds: Normal breath sounds.  Abdominal:     Palpations: Abdomen is soft.     Tenderness: There is no abdominal tenderness.  Musculoskeletal:     Cervical back: Neck supple.     Right lower leg: No edema.     Left lower leg: No edema.  Skin:    General: Skin is warm and dry.     Findings: No erythema or rash.  Neurological:     Mental Status: He is alert and oriented to person, place, and time.     ED Results /  Procedures / Treatments   Labs (all labs ordered are listed, but only abnormal results are displayed) Labs Reviewed  COMPREHENSIVE METABOLIC PANEL - Abnormal; Notable for the following components:      Result Value   Potassium 3.4 (*)    Glucose, Bld 111 (*)    AST 77 (*)    ALT 75 (*)    All other components within normal limits  SALICYLATE LEVEL - Abnormal; Notable for the following components:   Salicylate Lvl <7.0 (*)    All other components within normal limits  ACETAMINOPHEN LEVEL - Abnormal; Notable for the following components:   Acetaminophen (Tylenol), Serum <10 (*)    All other components within normal limits  CBG MONITORING, ED - Abnormal; Notable for the following components:   Glucose-Capillary 105 (*)    All other components within normal limits  ETHANOL  CBC WITH DIFFERENTIAL/PLATELET  RAPID URINE DRUG SCREEN, HOSP PERFORMED    EKG EKG Interpretation  Date/Time:  Friday Nov 20 2022 04:23:18 EDT Ventricular Rate:  87 PR Interval:  155 QRS Duration: 102 QT Interval:  347 QTC Calculation: 418 R  Axis:   89 Text Interpretation: Sinus rhythm RSR' in V1 or V2, probably normal variant ST elev, probable normal early repol pattern Confirmed by Drema Pry [phone]) on 11/20/2022 5:19:23 AM  Radiology No results found.  Procedures Procedures    Medications Ordered in ED Medications  naloxone (NARCAN) injection 0.4 mg (has no administration in time range)    ED Course/ Medical Decision Making/ A&P                             Medical Decision Making Amount and/or Complexity of Data Reviewed Labs: ordered.  Risk Prescription drug management.   This patient presents to the ED for concern of overdose, this involves an extensive number of treatment options, and is a complaint that carries with it a high risk of complications and morbidity.  The differential diagnosis includes overdose polysubstance abuse   Co morbidities that complicate the patient  evaluation  Hepatitis, polysubstance abuse, aspiration   Additional history obtained:  Additional history obtained from EMS who contributes to history as above External records from outside source obtained and reviewed including prior labs on file for comparison   Lab Tests:  I Ordered, and personally interpreted labs.  The pertinent results include: CBG 105.  Salicylate, acetaminophen, alcohol negative.  CBC within normal notes.  CMP with mildly elevated AST and ALT at 7775.  Potassium mildly low at 3.4.  Cardiac Monitoring: / EKG:  The patient was maintained on a cardiac monitor.  I personally viewed and interpreted the cardiac monitored which showed an underlying rhythm of: Sinus rhythm, rate 87  Problem List / ED Course / Critical interventions / Medication management  32 year old male presents via EMS after overdose on heroin.  Provided with IV and intranasal Narcan, arrives somnolent but arousing to verbal stimuli.  Supportive measures provided, able to maintain airway and O2 sat 100% on 2 L nasal cannula. I ordered medication including O2  for oxygen support Reevaluation of the patient after these medicines showed that the patient improved I have reviewed the patients home medicines and have made adjustments as needed   Social Determinants of Health:  No PCP on file   Test / Admission - Considered:  Disposition pending at time of shift change.          Final Clinical Impression(s) / ED Diagnoses Final diagnoses:  Accidental drug overdose, initial encounter    Rx / DC Orders ED Discharge Orders     None         Jeannie Fend, PA-C 11/20/22 0645    Nira Conn, MD 11/22/22 [phone]

## 2022-11-20 NOTE — ED Provider Notes (Signed)
Received handoff from L. Eulah Pont PA. Metabolizing. Heroin OD. Received Narcan by EMS.  Physical Exam  BP 115/78   Pulse 71   Temp 98.1 F (36.7 C) (Oral)   Resp 14   Ht 6' (1.829 m)   Wt 83.9 kg   SpO2 99%   BMI 25.09 kg/m   Physical Exam Vitals and nursing note reviewed.  Constitutional:      General: He is not in acute distress.    Appearance: He is well-developed.  HENT:     Head: Normocephalic and atraumatic.  Eyes:     Conjunctiva/sclera: Conjunctivae normal.  Cardiovascular:     Rate and Rhythm: Normal rate and regular rhythm.     Heart sounds: No murmur heard. Pulmonary:     Effort: Pulmonary effort is normal. No respiratory distress.     Breath sounds: Normal breath sounds.  Abdominal:     Palpations: Abdomen is soft.     Tenderness: There is no abdominal tenderness.  Musculoskeletal:        General: No swelling.     Cervical back: Neck supple.  Skin:    General: Skin is warm and dry.     Capillary Refill: Capillary refill takes less than 2 seconds.  Neurological:     Mental Status: He is alert.  Psychiatric:        Mood and Affect: Mood normal.     Procedures  Procedures  ED Course / MDM    Medical Decision Making Amount and/or Complexity of Data Reviewed Labs: ordered.  Risk Prescription drug management.   Patient up and walking, asking to go home.  Given coffee, pt discussed with family member, who will pick patient up.  Discharged home.      Pete Pelt, [place] 11/20/22 1555    Sloan Leiter, DO 11/21/22 1928
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
## 2022-11-20 NOTE — ED Notes (Signed)
Patient is currently resting.

## 2023-04-11 IMAGING — MR MR HEAD W/O CM
11 series · 48 of 48 positions shown · non-contrast
Comparison: None.

CLINICAL DATA: Acute neurologic deficit

EXAM:
MRI HEAD WITHOUT CONTRAST
TECHNIQUE: Multiplanar, multiecho pulse sequences of the brain and surrounding
structures were obtained without intravenous contrast.

[Series 5: DWI · axial · 3.0mm · 1.36mm/px · z∈[-88,+58]mm · 7 of 100 slices shown (1 of 2)]
[im 1/100]
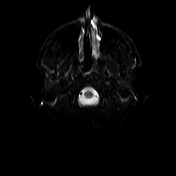
[im 17/100]
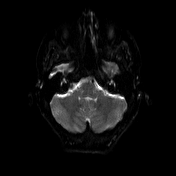
[im 34/100]
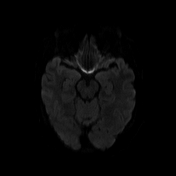
[im 50/100]
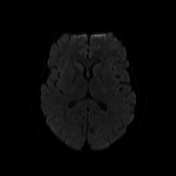
[im 67/100]
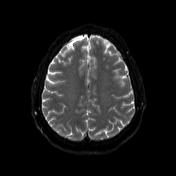
[im 83/100]
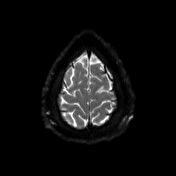
[im 100/100]
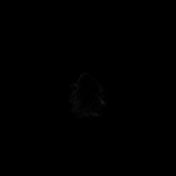

[Series 6: DWI · axial · 3.0mm · 1.36mm/px · z∈[-88,+58]mm · 4 of 50 slices shown (2 of 2)]
[im 1/50]
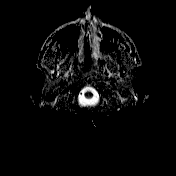
[im 17/50]
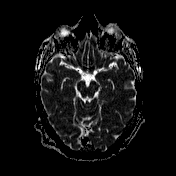
[im 33/50]
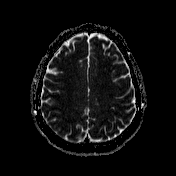
[im 50/50]
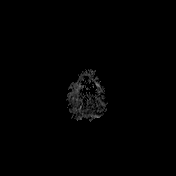

[Series 7: T1 · sagittal · 5.0mm · 0.75mm/px · 2 of 24 slices shown (1 of 2)]
[im 1/24]
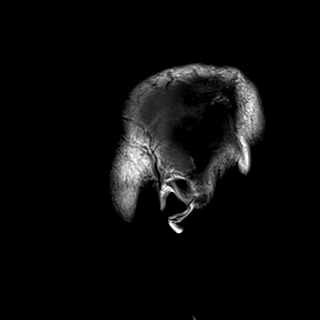
[im 24/24]
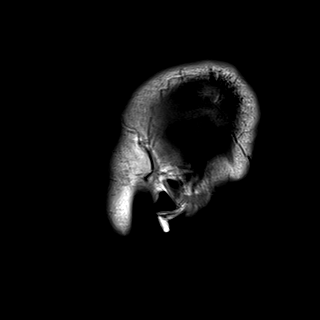

[Series 8: T2 · axial · 5.0mm · 0.62mm/px · z∈[-96,+66]mm · 2 of 26 slices shown (1 of 2)]
[im 1/26]
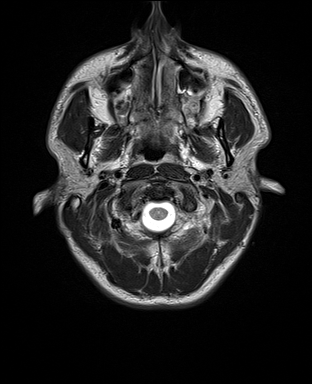
[im 26/26]
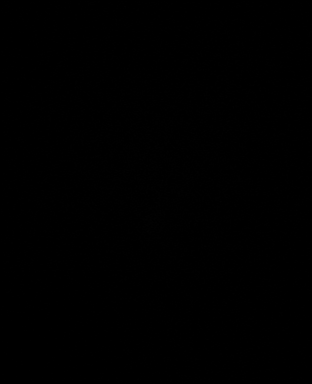

[Series 9: swi_images · axial · 3.0mm · 0.75mm/px · z∈[-103,+73]mm · 5 of 60 slices shown]
[im 1/60]
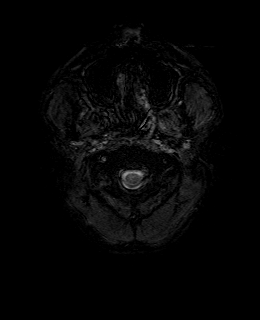
[im 15/60]
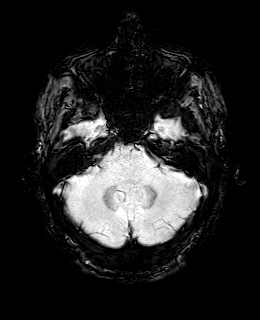
[im 30/60]
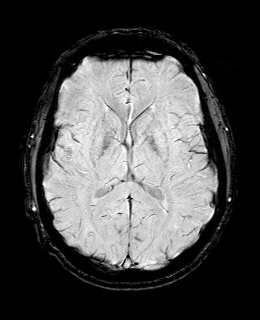
[im 45/60]
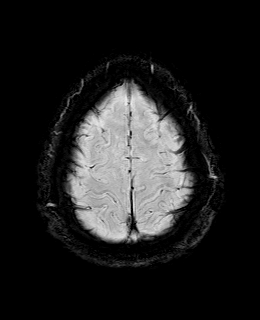
[im 60/60]
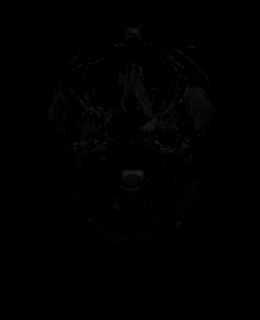

[Series 10: mip_images(sw) · axial · 24.0mm · 0.75mm/px · z∈[-93,+63]mm · 4 of 53 slices shown]
[im 1/53]
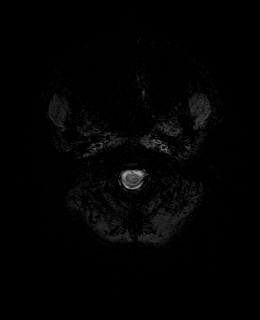
[im 18/53]
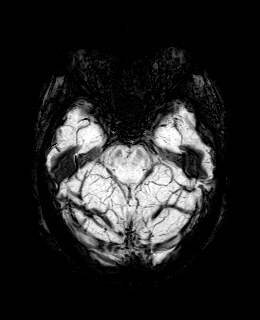
[im 35/53]
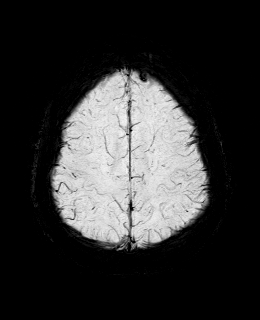
[im 53/53]
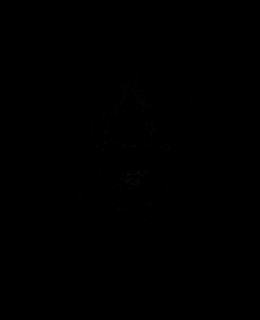

[Series 11: FLAIR · axial · 3.0mm · 0.75mm/px · z∈[-91,+61]mm · 4 of 52 slices shown]
[im 1/52]
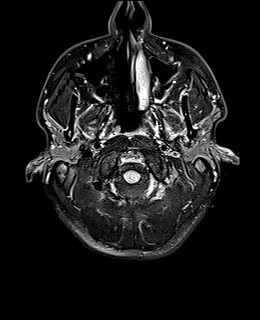
[im 18/52]
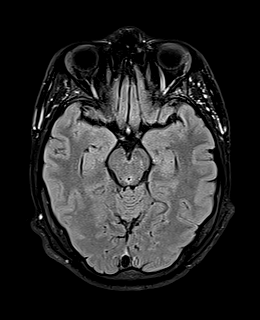
[im 35/52]
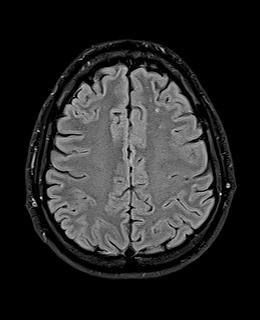
[im 52/52]
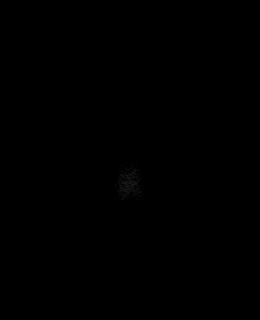

[Series 12: T1 · axial · 1.0mm · 0.94mm/px · z∈[-86,+56]mm · 11 of 144 slices shown (2 of 2)]
[im 1/144]
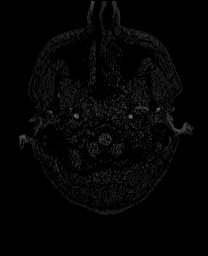
[im 15/144]
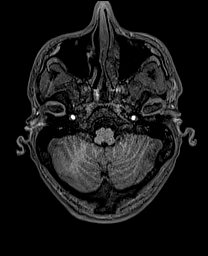
[im 29/144]
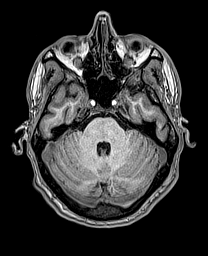
[im 43/144]
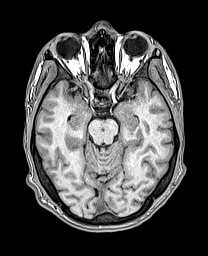
[im 58/144]
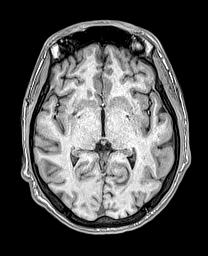
[im 72/144]
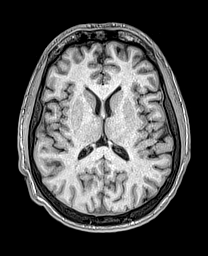
[im 86/144]
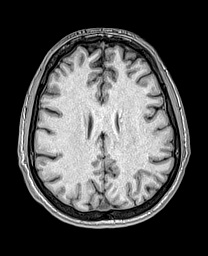
[im 101/144]
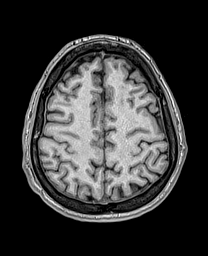
[im 115/144]
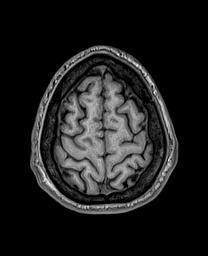
[im 129/144]
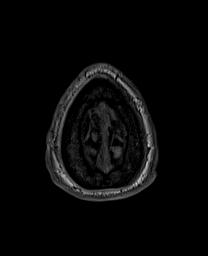
[im 144/144]
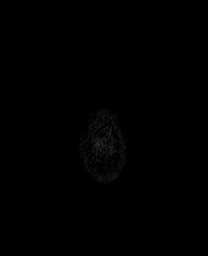

[Series 13: cor dwi_tracew · coronal · 5.0mm · 1.53mm/px · 4 of 54 slices shown]
[im 1/54]
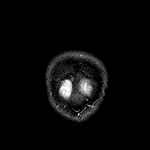
[im 18/54]
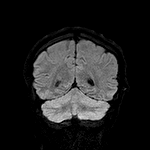
[im 36/54]
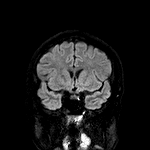
[im 54/54]
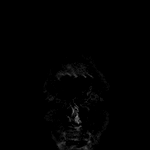

[Series 14: cor dwi_adc · coronal · 5.0mm · 1.53mm/px · 2 of 27 slices shown]
[im 1/27]
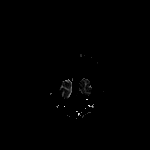
[im 27/27]
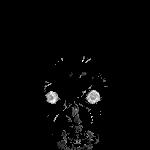

[Series 15: T2 · coronal · 5.0mm · 0.57mm/px · 3 of 35 slices shown (2 of 2)]
[im 1/35]
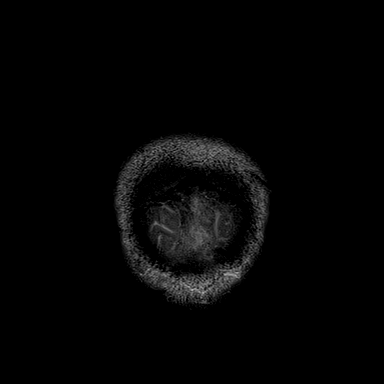
[im 18/35]
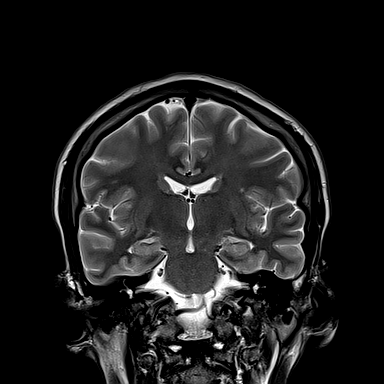
[im 35/35]
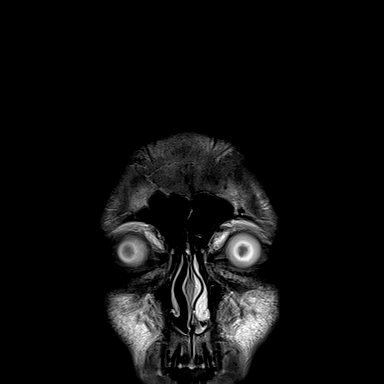

[48 of 48 positions shown; findings below may reference images not displayed]

FINDINGS: Brain: No acute infarct, mass effect or extra-axial collection. No
acute or chronic hemorrhage. Bilateral, frontal predominant,
multifocal hyperintense T2-weighted signal in the white matter, as
may be seen in patients with migraines, but is also commonly present
in asymptomatic patients. The midline structures are normal.

Vascular: Major flow voids are preserved.

Skull and upper cervical spine: Normal calvarium and skull base.
Visualized upper cervical spine and soft tissues are normal.

Sinuses/Orbits:No paranasal sinus fluid levels or advanced mucosal
thickening. No mastoid or middle ear effusion. Normal orbits.
IMPRESSION: 1. No acute intracranial abnormality.

## 2023-12-22 ENCOUNTER — Emergency Department (HOSPITAL_COMMUNITY): Payer: Self-pay

## 2023-12-22 ENCOUNTER — Encounter (HOSPITAL_COMMUNITY): Payer: Self-pay

## 2023-12-22 ENCOUNTER — Encounter (HOSPITAL_COMMUNITY): Payer: Self-pay | Admitting: Emergency Medicine

## 2023-12-22 ENCOUNTER — Other Ambulatory Visit: Payer: Self-pay

## 2023-12-22 ENCOUNTER — Emergency Department (HOSPITAL_COMMUNITY)
Admission: EM | Admit: 2023-12-22 | Discharge: 2023-12-22 | Disposition: A | Discharge status: Home / Self Care | Payer: Self-pay | Attending: Emergency Medicine | Admitting: Emergency Medicine

## 2023-12-22 DIAGNOSIS — Z23 Encounter for immunization: Secondary | ICD-10-CM | POA: Insufficient documentation

## 2023-12-22 DIAGNOSIS — T148XXA Other injury of unspecified body region, initial encounter: Secondary | ICD-10-CM

## 2023-12-22 DIAGNOSIS — S31139A Puncture wound of abdominal wall without foreign body, unspecified quadrant without penetration into peritoneal cavity, initial encounter: Secondary | ICD-10-CM | POA: Insufficient documentation

## 2023-12-22 LAB — COMPREHENSIVE METABOLIC PANEL WITH GFR
ALT: 36 U/L (ref 0–44)
AST: 33 U/L (ref 15–41)
Albumin: 3.7 g/dL (ref 3.5–5.0)
Alkaline Phosphatase: 47 U/L (ref 38–126)
Anion gap: 8 (ref 5–15)
BUN: 9 mg/dL (ref 6–20)
CO2: 28 mmol/L (ref 22–32)
Calcium: 9.2 mg/dL (ref 8.9–10.3)
Chloride: 102 mmol/L (ref 98–111)
Creatinine, Ser: 1.16 mg/dL (ref 0.61–1.24)
GFR, Estimated: >60 mL/min (ref >60)
Glucose, Bld: 99 mg/dL (ref 70–99)
Potassium: 3.5 mmol/L (ref 3.5–5.1)
Sodium: 138 mmol/L (ref 135–145)
Total Bilirubin: 0.7 mg/dL (ref 0.0–1.2)
Total Protein: 7.2 g/dL (ref 6.5–8.1)

## 2023-12-22 LAB — SAMPLE TO BLOOD BANK

## 2023-12-22 LAB — I-STAT CG4 LACTIC ACID, ED: Lactic Acid, Venous: 1.4 mmol/L (ref 0.5–1.9)

## 2023-12-22 LAB — PROTIME-INR
INR: 1.2 (ref 0.8–1.2)
Prothrombin Time: 15.4 s — ABNORMAL HIGH (ref 11.4–15.2)

## 2023-12-22 LAB — CBC
HCT: 46.8 % (ref 39.0–52.0)
Hemoglobin: 15.3 g/dL (ref 13.0–17.0)
MCH: 26.5 pg (ref 26.0–34.0)
MCHC: 32.7 g/dL (ref 30.0–36.0)
MCV: 81.1 fL (ref 80.0–100.0)
Platelets: 246 10*3/uL (ref 150–400)
RBC: 5.77 MIL/uL (ref 4.22–5.81)
RDW: 12.1 % (ref 11.5–15.5)
WBC: 10.1 10*3/uL (ref 4.0–10.5)
nRBC: 0 % (ref 0.0–0.2)

## 2023-12-22 LAB — I-STAT CHEM 8, ED
BUN: 10 mg/dL (ref 6–20)
Calcium, Ion: 1.09 mmol/L — ABNORMAL LOW (ref 1.15–1.40)
Chloride: 99 mmol/L (ref 98–111)
Creatinine, Ser: 1.2 mg/dL (ref 0.61–1.24)
Glucose, Bld: 96 mg/dL (ref 70–99)
HCT: 47 % (ref 39.0–52.0)
Hemoglobin: 16 g/dL (ref 13.0–17.0)
Potassium: 3.4 mmol/L — ABNORMAL LOW (ref 3.5–5.1)
Sodium: 140 mmol/L (ref 135–145)
TCO2: 26 mmol/L (ref 22–32)

## 2023-12-22 LAB — ETHANOL: Alcohol, Ethyl (B): <15 mg/dL (ref <15)

## 2023-12-22 MED ORDER — FENTANYL CITRATE PF 50 MCG/ML IJ SOSY
50.0000 ug | PREFILLED_SYRINGE | Freq: Once | INTRAMUSCULAR | Status: AC
  Administered 2023-12-22: 50 ug via INTRAVENOUS
  Filled 2023-12-22: qty 1

## 2023-12-22 MED ORDER — HYDROCODONE-ACETAMINOPHEN 5-325 MG PO TABS: 1.0000 | ORAL_TABLET | ORAL | 0 refills | Status: DC | PRN

## 2023-12-22 MED ORDER — TETANUS-DIPHTH-ACELL PERTUSSIS 5-2.5-18.5 LF-MCG/0.5 IM SUSY
0.5000 mL | PREFILLED_SYRINGE | Freq: Once | INTRAMUSCULAR | Status: AC
  Administered 2023-12-22: 0.5 mL via INTRAMUSCULAR
  Filled 2023-12-22: qty 0.5

## 2023-12-22 MED ORDER — IOHEXOL 350 MG/ML SOLN
75.0000 mL | Freq: Once | INTRAVENOUS | Status: AC | PRN
  Administered 2023-12-22: 75 mL via INTRAVENOUS

## 2023-12-22 MED ORDER — CEPHALEXIN 500 MG PO CAPS: 500.0000 mg | ORAL_CAPSULE | Freq: Four times a day (QID) | ORAL | 0 refills | Status: DC

## 2023-12-22 MED ORDER — LIDOCAINE-EPINEPHRINE (PF) 2 %-1:200000 IJ SOLN
10.0000 mL | Freq: Once | INTRAMUSCULAR | Status: AC
  Administered 2023-12-22: 10 mL
  Filled 2023-12-22: qty 20
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
## 2023-12-22 NOTE — ED Notes (Signed)
 Patient transported to CT
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
## 2023-12-22 NOTE — ED Notes (Signed)
 EDP at bedside with suture cart repairing wound.
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
## 2023-12-22 NOTE — ED Notes (Signed)
 CCMD called for monitoring

## 2023-12-22 NOTE — ED Notes (Signed)
 Lidocaine  w epi at bedside with suture tray
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
## 2023-12-22 NOTE — ED Notes (Signed)
 Bulky pressure dressing and gauze applied over stitches

## 2024-07-19 ENCOUNTER — Other Ambulatory Visit: Payer: Self-pay

## 2024-07-19 ENCOUNTER — Emergency Department (HOSPITAL_COMMUNITY): Payer: Self-pay

## 2024-07-19 ENCOUNTER — Encounter (HOSPITAL_COMMUNITY): Payer: Self-pay

## 2024-07-19 ENCOUNTER — Emergency Department (HOSPITAL_COMMUNITY)
Admission: EM | Admit: 2024-07-19 | Discharge: 2024-07-19 | Payer: Self-pay | Attending: Emergency Medicine | Admitting: Emergency Medicine

## 2024-07-19 DIAGNOSIS — B1920 Unspecified viral hepatitis C without hepatic coma: Secondary | ICD-10-CM

## 2024-07-19 DIAGNOSIS — R0789 Other chest pain: Secondary | ICD-10-CM | POA: Insufficient documentation

## 2024-07-19 DIAGNOSIS — S0990XA Unspecified injury of head, initial encounter: Secondary | ICD-10-CM

## 2024-07-19 DIAGNOSIS — S01111A Laceration without foreign body of right eyelid and periocular area, initial encounter: Secondary | ICD-10-CM | POA: Insufficient documentation

## 2024-07-19 DIAGNOSIS — W25XXXA Contact with sharp glass, initial encounter: Secondary | ICD-10-CM | POA: Insufficient documentation

## 2024-07-19 LAB — CBC
HCT: 44.9 % (ref 39.0–52.0)
Hemoglobin: 15.7 g/dL (ref 13.0–17.0)
MCH: 27.4 pg (ref 26.0–34.0)
MCHC: 35 g/dL (ref 30.0–36.0)
MCV: 78.2 fL — ABNORMAL LOW (ref 80.0–100.0)
Platelets: 283 K/uL (ref 150–400)
RBC: 5.74 MIL/uL (ref 4.22–5.81)
RDW: 12.5 % (ref 11.5–15.5)
WBC: 16.6 K/uL — ABNORMAL HIGH (ref 4.0–10.5)
nRBC: 0 % (ref 0.0–0.2)

## 2024-07-19 LAB — HEPATITIS B SURFACE ANTIGEN: Hepatitis B Surface Ag: NONREACTIVE

## 2024-07-19 LAB — BASIC METABOLIC PANEL WITH GFR
Anion gap: 11 (ref 5–15)
BUN: 10 mg/dL (ref 6–20)
CO2: 23 mmol/L (ref 22–32)
Calcium: 9.3 mg/dL (ref 8.9–10.3)
Chloride: 101 mmol/L (ref 98–111)
Creatinine, Ser: 0.89 mg/dL (ref 0.61–1.24)
GFR, Estimated: >60 mL/min
Glucose, Bld: 103 mg/dL — ABNORMAL HIGH (ref 70–99)
Potassium: 4 mmol/L (ref 3.5–5.1)
Sodium: 135 mmol/L (ref 135–145)

## 2024-07-19 LAB — RAPID HIV SCREEN (HIV 1/2 AB+AG)
HIV 1/2 Antibodies: NONREACTIVE
HIV-1 P24 Antigen - HIV24: NONREACTIVE

## 2024-07-19 LAB — HEPATITIS C ANTIBODY: HCV Ab: REACTIVE — AB

## 2024-07-19 MED ORDER — SODIUM CHLORIDE 0.9 % IV BOLUS
1000.0000 mL | Freq: Once | INTRAVENOUS | Status: AC
  Administered 2024-07-19: 1000 mL via INTRAVENOUS

## 2024-07-19 MED ORDER — LIDOCAINE-EPINEPHRINE-TETRACAINE (LET) TOPICAL GEL
3.0000 mL | Freq: Once | TOPICAL | Status: AC
  Administered 2024-07-19: 3 mL via TOPICAL
  Filled 2024-07-19: qty 3

## 2024-07-19 MED ORDER — OXYCODONE-ACETAMINOPHEN 5-325 MG PO TABS
2.0000 | ORAL_TABLET | Freq: Once | ORAL | Status: AC
  Administered 2024-07-19: 2 via ORAL
  Filled 2024-07-19: qty 2

## 2024-07-19 NOTE — Discharge Instructions (Addendum)
 You tested positive for hepatitis C on today's visit, we sent off a blood work that will tell us  if this is active versus chronic.  We will call with results as discussed.  You had 2 sutures placed to your eyebrow, you will need to have these removed within 5 to 7 days, please keep your wound clean and dry.

## 2024-07-19 NOTE — ED Notes (Addendum)
[employer] at bedside 
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
## 2024-07-19 NOTE — ED Notes (Signed)
 Patient transported to X-ray
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
## 2024-07-19 NOTE — ED Notes (Addendum)
 Patient discharged by RN. Patient verbalizes understanding of instructions. Patient aware of hepatitis diagnosis and states he was dx a year ago. PD notified that stitches to come out in approximately a week. Patient verbalizes understanding as well. No additional questions for RN. Patient in police custody at time of discharge.
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
## 2024-07-20 LAB — HCV RNA DIAGNOSIS, NAA
HCV RNA, Quantitation: 728000 [IU]/mL
HCV RNA, log10: 5.862 {Log_IU}/mL

## 2024-07-20 LAB — HCV RNA QUANT
HCV Quantitative Log: 5.865 {Log_IU}/mL
HCV Quantitative: 733000 [IU]/mL
